# Patient Record
Sex: Female | Born: 1955
Health system: Southern US, Community
[De-identification: ages and names within clinical notes are randomized; demographics above are authoritative.]

## PROBLEM LIST (undated history)

## (undated) DIAGNOSIS — E785 Hyperlipidemia, unspecified: Secondary | ICD-10-CM

## (undated) DIAGNOSIS — G4730 Sleep apnea, unspecified: Secondary | ICD-10-CM

## (undated) DIAGNOSIS — M8580 Other specified disorders of bone density and structure, unspecified site: Secondary | ICD-10-CM

## (undated) HISTORY — PX: SHOULDER SURGERY: SHX246

## (undated) HISTORY — PX: FRACTURE SURGERY: SHX138

## (undated) HISTORY — DX: Hyperlipidemia, unspecified: E78.5

## (undated) HISTORY — DX: Other specified disorders of bone density and structure, unspecified site: M85.80

## (undated) HISTORY — DX: Sleep apnea, unspecified: G47.30

---

## 2004-02-17 ENCOUNTER — Encounter: Payer: Self-pay | Admitting: Family Medicine

## 2004-06-07 ENCOUNTER — Ambulatory Visit: Payer: Self-pay | Admitting: Obstetrics and Gynecology

## 2005-07-20 ENCOUNTER — Encounter: Payer: Self-pay | Admitting: Family Medicine

## 2005-09-21 ENCOUNTER — Ambulatory Visit: Payer: Self-pay | Admitting: Obstetrics and Gynecology

## 2005-09-27 ENCOUNTER — Ambulatory Visit: Payer: Self-pay | Admitting: Obstetrics and Gynecology

## 2006-02-13 ENCOUNTER — Ambulatory Visit: Payer: Self-pay | Admitting: Family Medicine

## 2006-04-30 ENCOUNTER — Ambulatory Visit: Payer: Self-pay | Admitting: Obstetrics and Gynecology

## 2006-05-28 ENCOUNTER — Ambulatory Visit: Payer: Self-pay | Admitting: Family Medicine

## 2007-10-29 ENCOUNTER — Ambulatory Visit: Payer: Self-pay | Admitting: Obstetrics and Gynecology

## 2007-10-29 ENCOUNTER — Ambulatory Visit: Payer: Self-pay | Admitting: Family Medicine

## 2007-12-16 ENCOUNTER — Encounter: Payer: Self-pay | Admitting: Family Medicine

## 2007-12-16 ENCOUNTER — Ambulatory Visit: Payer: Self-pay | Admitting: Gastroenterology

## 2008-05-20 ENCOUNTER — Ambulatory Visit: Payer: Self-pay | Admitting: Family Medicine

## 2008-05-20 DIAGNOSIS — M899 Disorder of bone, unspecified: Secondary | ICD-10-CM | POA: Insufficient documentation

## 2008-05-20 DIAGNOSIS — M810 Age-related osteoporosis without current pathological fracture: Secondary | ICD-10-CM

## 2008-05-20 DIAGNOSIS — F329 Major depressive disorder, single episode, unspecified: Secondary | ICD-10-CM | POA: Insufficient documentation

## 2008-07-07 ENCOUNTER — Ambulatory Visit: Payer: Self-pay | Admitting: Family Medicine

## 2008-12-23 ENCOUNTER — Encounter: Payer: Self-pay | Admitting: Family Medicine

## 2008-12-23 ENCOUNTER — Other Ambulatory Visit: Admission: RE | Admit: 2008-12-23 | Discharge: 2008-12-23 | Payer: Self-pay | Admitting: Family Medicine

## 2008-12-23 ENCOUNTER — Ambulatory Visit: Payer: Self-pay | Admitting: Family Medicine

## 2008-12-25 LAB — CONVERTED CEMR LAB
Albumin: 3.8 g/dL (ref 3.5–5.2)
Alkaline Phosphatase: 74 units/L (ref 39–117)
Basophils Absolute: 0 10*3/uL (ref 0.0–0.1)
Basophils Relative: 0 % (ref 0.0–3.0)
Calcium: 9.4 mg/dL (ref 8.4–10.5)
Eosinophils Relative: 1 % (ref 0.0–5.0)
GFR calc non Af Amer: 92.51 mL/min (ref >60)
Glucose, Bld: 75 mg/dL (ref 70–99)
HCT: 39.1 % (ref 36.0–46.0)
HDL: 70.4 mg/dL (ref >39.00)
Hemoglobin: 13.2 g/dL (ref 12.0–15.0)
Lymphs Abs: 1.8 10*3/uL (ref 0.7–4.0)
Monocytes Relative: 9.7 % (ref 3.0–12.0)
Neutro Abs: 1.6 10*3/uL (ref 1.4–7.7)
RDW: 15.3 % — ABNORMAL HIGH (ref 11.5–14.6)
Sodium: 144 meq/L (ref 135–145)
Total CHOL/HDL Ratio: 3
Triglycerides: 60 mg/dL (ref 0.0–149.0)

## 2008-12-29 ENCOUNTER — Encounter (INDEPENDENT_AMBULATORY_CARE_PROVIDER_SITE_OTHER): Payer: Self-pay | Admitting: *Deleted

## 2008-12-29 ENCOUNTER — Ambulatory Visit: Payer: Self-pay | Admitting: Family Medicine

## 2008-12-29 ENCOUNTER — Encounter: Payer: Self-pay | Admitting: Family Medicine

## 2009-01-05 ENCOUNTER — Encounter: Payer: Self-pay | Admitting: Family Medicine

## 2009-01-05 ENCOUNTER — Ambulatory Visit: Payer: Self-pay | Admitting: Family Medicine

## 2009-01-14 ENCOUNTER — Encounter (INDEPENDENT_AMBULATORY_CARE_PROVIDER_SITE_OTHER): Payer: Self-pay | Admitting: *Deleted

## 2009-03-03 ENCOUNTER — Ambulatory Visit: Payer: Self-pay | Admitting: Family Medicine

## 2009-03-03 DIAGNOSIS — E7800 Pure hypercholesterolemia, unspecified: Secondary | ICD-10-CM | POA: Insufficient documentation

## 2009-03-04 ENCOUNTER — Ambulatory Visit: Payer: Self-pay | Admitting: Family Medicine

## 2009-03-08 LAB — CONVERTED CEMR LAB
ALT: 24 units/L (ref 0–35)
AST: 22 units/L (ref 0–37)
Basophils Absolute: 0 10*3/uL (ref 0.0–0.1)
Basophils Relative: 0.5 % (ref 0.0–3.0)
Cholesterol: 207 mg/dL — ABNORMAL HIGH (ref 0–200)
Direct LDL: 125.9 mg/dL
Eosinophils Absolute: 0 10*3/uL (ref 0.0–0.7)
Eosinophils Relative: 0.8 % (ref 0.0–5.0)
HCT: 37.4 % (ref 36.0–46.0)
HDL: 72.3 mg/dL (ref >39.00)
Hemoglobin: 12.8 g/dL (ref 12.0–15.0)
Lymphocytes Relative: 37 % (ref 12.0–46.0)
Lymphs Abs: 1.7 10*3/uL (ref 0.7–4.0)
MCHC: 34.2 g/dL (ref 30.0–36.0)
MCV: 95 fL (ref 78.0–100.0)
Monocytes Absolute: 0.4 10*3/uL (ref 0.1–1.0)
Monocytes Relative: 9.5 % (ref 3.0–12.0)
Neutro Abs: 2.5 10*3/uL (ref 1.4–7.7)
Neutrophils Relative %: 52.2 % (ref 43.0–77.0)
Platelets: 191 10*3/uL (ref 150.0–400.0)
RBC: 3.94 M/uL (ref 3.87–5.11)
RDW: 13.1 % (ref 11.5–14.6)
Total CHOL/HDL Ratio: 3
Triglycerides: 77 mg/dL (ref 0.0–149.0)
VLDL: 15.4 mg/dL (ref 0.0–40.0)
Vit D, 25-Hydroxy: 38 ng/mL (ref 30–89)
WBC: 4.6 10*3/uL (ref 4.5–10.5)

## 2009-12-30 ENCOUNTER — Ambulatory Visit: Payer: Self-pay | Admitting: Family Medicine

## 2009-12-30 DIAGNOSIS — E669 Obesity, unspecified: Secondary | ICD-10-CM | POA: Insufficient documentation

## 2009-12-30 DIAGNOSIS — E663 Overweight: Secondary | ICD-10-CM | POA: Insufficient documentation

## 2009-12-30 DIAGNOSIS — E6609 Other obesity due to excess calories: Secondary | ICD-10-CM | POA: Insufficient documentation

## 2010-01-03 ENCOUNTER — Telehealth: Payer: Self-pay | Admitting: Family Medicine

## 2010-01-13 ENCOUNTER — Ambulatory Visit: Payer: Self-pay | Admitting: Pulmonary Disease

## 2010-01-20 ENCOUNTER — Encounter: Payer: Self-pay | Admitting: Pulmonary Disease

## 2010-01-21 ENCOUNTER — Encounter: Payer: Self-pay | Admitting: Pulmonary Disease

## 2010-01-21 ENCOUNTER — Ambulatory Visit (HOSPITAL_BASED_OUTPATIENT_CLINIC_OR_DEPARTMENT_OTHER): Admission: RE | Admit: 2010-01-21 | Discharge: 2010-01-21 | Payer: Self-pay | Admitting: Pulmonary Disease

## 2010-01-24 ENCOUNTER — Telehealth: Payer: Self-pay | Admitting: Pulmonary Disease

## 2010-02-02 ENCOUNTER — Telehealth (INDEPENDENT_AMBULATORY_CARE_PROVIDER_SITE_OTHER): Payer: Self-pay | Admitting: *Deleted

## 2010-02-04 ENCOUNTER — Ambulatory Visit: Payer: Self-pay | Admitting: Family Medicine

## 2010-02-04 ENCOUNTER — Ambulatory Visit: Payer: Self-pay | Admitting: Pulmonary Disease

## 2010-02-04 ENCOUNTER — Encounter: Payer: Self-pay | Admitting: Family Medicine

## 2010-02-04 ENCOUNTER — Telehealth (INDEPENDENT_AMBULATORY_CARE_PROVIDER_SITE_OTHER): Payer: Self-pay | Admitting: *Deleted

## 2010-02-07 ENCOUNTER — Encounter: Payer: Self-pay | Admitting: Family Medicine

## 2010-02-16 ENCOUNTER — Ambulatory Visit: Payer: Self-pay | Admitting: Pulmonary Disease

## 2010-02-16 DIAGNOSIS — G4733 Obstructive sleep apnea (adult) (pediatric): Secondary | ICD-10-CM | POA: Insufficient documentation

## 2010-03-08 ENCOUNTER — Telehealth: Payer: Self-pay | Admitting: Family Medicine

## 2010-03-08 ENCOUNTER — Ambulatory Visit: Payer: Self-pay | Admitting: Family Medicine

## 2010-03-08 ENCOUNTER — Telehealth (INDEPENDENT_AMBULATORY_CARE_PROVIDER_SITE_OTHER): Payer: Self-pay | Admitting: *Deleted

## 2010-03-08 LAB — CONVERTED CEMR LAB
ALT: 36 units/L — ABNORMAL HIGH (ref 0–35)
AST: 25 units/L (ref 0–37)
Albumin: 3.7 g/dL (ref 3.5–5.2)
BUN: 22 mg/dL (ref 6–23)
Chloride: 106 meq/L (ref 96–112)
Direct LDL: 153.9 mg/dL
Eosinophils Relative: 1.6 % (ref 0.0–5.0)
Glucose, Bld: 81 mg/dL (ref 70–99)
HCT: 38.6 % (ref 36.0–46.0)
Hemoglobin: 13.5 g/dL (ref 12.0–15.0)
Lymphs Abs: 1.7 10*3/uL (ref 0.7–4.0)
MCV: 91.6 fL (ref 78.0–100.0)
Monocytes Absolute: 0.4 10*3/uL (ref 0.1–1.0)
Monocytes Relative: 9.3 % (ref 3.0–12.0)
Neutro Abs: 1.9 10*3/uL (ref 1.4–7.7)
Platelets: 216 10*3/uL (ref 150.0–400.0)
Potassium: 4.5 meq/L (ref 3.5–5.1)
Sodium: 141 meq/L (ref 135–145)
TSH: 2.4 microintl units/mL (ref 0.35–5.50)
Total Bilirubin: 0.7 mg/dL (ref 0.3–1.2)
Total Protein: 6.3 g/dL (ref 6.0–8.3)
VLDL: 7.6 mg/dL (ref 0.0–40.0)
WBC: 4.1 10*3/uL — ABNORMAL LOW (ref 4.5–10.5)

## 2010-03-10 ENCOUNTER — Ambulatory Visit: Payer: Self-pay | Admitting: Family Medicine

## 2010-04-27 ENCOUNTER — Telehealth: Payer: Self-pay | Admitting: Family Medicine

## 2010-05-25 ENCOUNTER — Telehealth: Payer: Self-pay | Admitting: Family Medicine

## 2010-05-31 ENCOUNTER — Ambulatory Visit
Admission: RE | Admit: 2010-05-31 | Discharge: 2010-05-31 | Payer: Self-pay | Source: Home / Self Care | Attending: Family Medicine | Admitting: Family Medicine

## 2010-06-02 ENCOUNTER — Ambulatory Visit
Admission: RE | Admit: 2010-06-02 | Discharge: 2010-06-02 | Payer: Self-pay | Source: Home / Self Care | Attending: Family Medicine | Admitting: Family Medicine

## 2010-06-05 LAB — CONVERTED CEMR LAB
AST: 20 units/L (ref 0–37)
Cholesterol: 215 mg/dL (ref 0–200)
HDL: 77.9 mg/dL (ref >39.0)
Triglycerides: 64 mg/dL (ref 0–149)

## 2010-06-07 NOTE — Miscellaneous (Signed)
Summary: Orders Update  Clinical Lists Changes  Orders: Added new Referral order of Sleep Disorder Referral (Sleep Disorder) - Signed 

## 2010-06-07 NOTE — Assessment & Plan Note (Signed)
Summary: PAP SMEAR AND CPX/CLE   Vital Signs:  Patient profile:   55 year old female Height:      64.5 inches Weight:      231.75 pounds BMI:     39.31 Temp:     97.9 degrees F oral Pulse rate:   80 / minute Pulse rhythm:   regular BP sitting:   122 / 82  (left arm) Cuff size:   large  Vitals Entered By: Lewanda Rife LPN (March 10, 2010 10:52 AM) CC: CPX LMP 2006   History of Present Illness: here for wellness exam and to rev chronic health problems  is feeling good   nothing new   wt is down 1 lb with bmi of 39  122/82-- good bp  nl pap 8/10 all paps nl since leep yearly in 1997  no symptoms or problems or new partners  no periods   mam 9/11 nl  self exam -- no lumps or changes   colonosc nl in 09- due 10 y  dexa 8/10 osteopenia nl vit D at 60 is exercising and taking calcium   she did go off lexapro and doing ok  is more emotional    lipids are up with trig 38 and HDL 93 and LDL 153 (up from the 120s) starting 10 days ago -- started low carb high protien  more beef and cheese   has started exercising -- spinning classes and power toning class  was exercising 4-5 days per week since june  went off weight watchers in summer --  has succeeded in the past    Allergies (verified): No Known Drug Allergies  Past History:  Past Medical History: Last updated: 12/23/2008 cervical deg disc dz osteopenia depression obesity PMDD vaginal stricture hyperlipidemia  GI-- Dr Marva Panda neurosx -- [facility]  Past Surgical History: Last updated: 12/23/2008 colonosc 9/09- diverticulosis -- re check 10 y MRI (years ago) - cervical disk herniation with bone spurs  (1981) Cryotherapy for dysplasia   (1610) CS hysteroscopy/ d/c- leiomyoma- proliferative endometrium (1997) LEEP  (2005) fx in hand (2005) dexa  Family History: Last updated: 12/23/2008 father- leukemia mother non- hodgekins lymphoma MGM bone ca   Social History: Last updated:  01/13/2010 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 States she was a "social smoker."  Risk Factors: Smoking Status: quit (01/13/2010)  Review of Systems General:  Denies fatigue, loss of appetite, and malaise. Eyes:  Denies blurring and eye irritation. CV:  Denies chest pain or discomfort, lightheadness, palpitations, and shortness of breath with exertion. Resp:  Denies cough, shortness of breath, and wheezing. GI:  Denies abdominal pain, change in bowel habits, indigestion, and nausea. GU:  Denies abnormal vaginal bleeding, discharge, dysuria, and urinary frequency. MS:  Denies joint pain, joint redness, joint swelling, muscle aches, and cramps. Derm:  Denies itching, lesion(s), poor wound healing, and rash. Neuro:  Denies headaches, numbness, and tingling. Psych:  mood is ok . Endo:  Denies excessive thirst and excessive urination. Heme:  Denies abnormal bruising and bleeding.  Physical Exam  General:  overweight but generally well appearing  Head:  normocephalic, atraumatic, and no abnormalities observed.   Eyes:  vision grossly intact, pupils equal, pupils round, and pupils reactive to light.  no conjunctival pallor, injection or icterus  Ears:  R ear normal and L ear normal.   Nose:  no nasal discharge.   Mouth:  pharynx pink and moist.   Neck:  supple with full rom  and no masses or thyromegally, no JVD or carotid bruit  Chest Wall:  No deformities, masses, or tenderness noted. Breasts:  No mass, nodules, thickening, tenderness, bulging, retraction, inflamation, nipple discharge or skin changes noted.   Lungs:  Normal respiratory effort, chest expands symmetrically. Lungs are clear to auscultation, no crackles or wheezes. Heart:  Normal rate and regular rhythm. S1 and S2 normal without gallop, murmur, click, rub or other extra sounds. Abdomen:  Bowel sounds positive,abdomen soft and non-tender without  masses, organomegaly or hernias noted. Msk:  No deformity or scoliosis noted of thoracic or lumbar spine.   Pulses:  R and L carotid,radial,femoral,dorsalis pedis and posterior tibial pulses are full and equal bilaterally Extremities:  No clubbing, cyanosis, edema, or deformity noted with normal full range of motion of all joints.   Neurologic:  sensation intact to light touch, gait normal, and DTRs symmetrical and normal.   Skin:  Intact without suspicious lesions or rashes Cervical Nodes:  No lymphadenopathy noted Axillary Nodes:  No palpable lymphadenopathy Inguinal Nodes:  No significant adenopathy Psych:  normal affect, talkative and pleasant    Impression & Recommendations:  Problem # 1:  HEALTH MAINTENANCE EXAM (ICD-V70.0) Assessment Comment Only reviewed health habits including diet, exercise and skin cancer prevention reviewed health maintenance list and family history disc plan for wt loss rev labs in detail  Problem # 2:  OSTEOPENIA (ICD-733.90) Assessment: Unchanged dexa due next fall good vit D level rev supplements and exercise  Her updated medication list for this problem includes:    Vitamin D3 2000 Unit Tabs (Cholecalciferol) ..... One tab once daily    Calcium 600 600 Mg Tabs (Calcium carbonate) ..... One tab once daily    Calcium Carbonate-vitamin D 600-400 Mg-unit Tabs (Calcium carbonate-vitamin d) .Marland Kitchen... Take 1 tablet by mouth once a day  Problem # 3:  PURE HYPERCHOLESTEROLEMIA (ICD-272.0) Assessment: Deteriorated this is worse on high protien/ low carb diet too many sat fats adv to try fruit/ veg and low fat  will re check in 3 mo rev low sat fat diet in detail  Complete Medication List: 1)  One-a-day Womens Formula Tabs (Multiple vitamins-calcium) .... Take 1 tablet by mouth once a day 2)  Vitamin D3 2000 Unit Tabs (Cholecalciferol) .... One tab once daily 3)  Vitamin B Complex-c Caps (B complex-c) .... Take 1 capsule by mouth once a day 4)  Calcium  600 600 Mg Tabs (Calcium carbonate) .... One tab once daily 5)  Chromium Picolinate 800 Mcg Tabs (Chromium picolinate) .... One tab once daily 6)  Calcium Carbonate-vitamin D 600-400 Mg-unit Tabs (Calcium carbonate-vitamin d) .... Take 1 tablet by mouth once a day 7)  Fish Oil 1000 Mg Caps (Omega-3 fatty acids) .... Take 1 capsule by mouth once a day  Patient Instructions: 1)  stop chromium  2)  get back to weight watchers 3)  you can raise your HDL (good cholesterol) by increasing exercise and eating omega 3 fatty acid supplement like fish oil or flax seed oil over the counter 4)  you can lower LDL (bad cholesterol) by limiting saturated fats in diet like red meat, fried foods, egg yolks, fatty breakfast meats, high fat dairy products and shellfish 5)  schedule fasting lab in 3 months lipid/ast/alt 272 to see if chol goes down  6)  keep up the great exercise    Orders Added: 1)  Est. Patient 40-64 years [40347]    Current Allergies (reviewed today): No known allergies

## 2010-06-07 NOTE — Progress Notes (Signed)
Summary: speak to Emma Knight  Phone Note Call from Patient Call back at [facility] Phone [phone]   Caller: Patient Call For: Emma Knight Summary of Call: pt requests to speak ro Emma Knight re: ins.  Initial call taken by: Tivis Ringer, CNA,  January 24, 2010 9:18 AM  Follow-up for Phone Call        [facility] for pt to return my call. Emma Knight  January 24, 2010 10:28 AM Returned pt's call and she recvd. denial letter for the apnea link and was concerned that [employer] would not pay for her formal sleep study in the lab of which she had Friday 01/21/10. Pt read her denial to me and it was the same denial that we had recvd. for the apnea link Home Study which was denied. Pt thought that [employer] was not going to cover her formal study. Advised pt that [employer] denied apnea link but would cover her formal study (NPSG). Pt understood after speaking with [place], that this was just in reference to the apnea link. Alfonso Ramus  January 24, 2010 12:13 PM

## 2010-06-07 NOTE — Letter (Signed)
Summary: Results Follow up Letter  [facility] at [facility]  [address] [place], [place] [address]   Phone: [phone]  Fax: [phone]    02/07/2010 MRN: [id number]    ELLIEANA DOLECKI [address] [place], [place]  [address]    Dear Ms. KEEVEN,  The following are the results of your recent test(s):  Test         Result    Pap Smear:        Normal _____  Not Normal _____ Comments: ______________________________________________________ Cholesterol: LDL(Bad cholesterol):         Your goal is less than:         HDL (Good cholesterol):       Your goal is more than: Comments:  ______________________________________________________ Mammogram:        Normal __X___  Not Normal _____ Comments:Repeat in one year.   ___________________________________________________________________ Hemoccult:        Normal _____  Not normal _______ Comments:    _____________________________________________________________________ Other Tests:    We routinely do not discuss normal results over the telephone.  If you desire a copy of the results, or you have any questions about this information we can discuss them at your next office visit.   Sincerely,    Idamae Schuller Tower,MD  MT/ri

## 2010-06-07 NOTE — Assessment & Plan Note (Signed)
Summary: consult for possible osa   Visit Type:  Initial Consult Copy to:  Colon Flattery tower MD Primary Denea Cheaney/Referring Weldon Nouri:  Judith Part MD  CC:  Sleep Consult. Pt states she has been told she has on and off snoring through the night and Pt states she was told she quit breathing at times when she slept.  History of Present Illness: the pt is a 55y/o female who I have been asked to see for possible osa.  She has been noted to have loud snoring by various roommates on trips, and most recently was noted to have an abnormal breathing pattern during sleep by a friend who is a Engineer, civil (consulting).  She denies any choking arousals.  She typically goes to bed btw 10-11pm, and arises at 5-8am to start her day.  She usually feels rested.  She describes some daytime sleepiness during the day when doing quiet things, but does not feel it affects her alertness, concentration, or QOL.  She has no sleepiness in the evening while watching tv, and no issues with driving.  Her epworth score today is only 4.  She has been trying to exercise more, and her weight is down 25 pounds over the past year or so.    Preventive Screening-Counseling & Management  Alcohol-Tobacco     Smoking Status: quit  Problems Prior to Update: 1)  Obesity  (ICD-278.00) 2)  Snoring  (ICD-786.09) 3)  Mass, Superficial  (ICD-782.2) 4)  Lymphangitis  (ICD-457.2) 5)  Leukocytopenia Unspecified  (ICD-288.50) 6)  Pure Hypercholesterolemia  (ICD-272.0) 7)  Other Screening Mammogram  (ICD-V76.12) 8)  Routine Gynecological Examination  (ICD-V72.31) 9)  Health Maintenance Exam  (ICD-V70.0) 10)  Knee Pain, Right  (ICD-719.46) 11)  Elevated Blood Pressure Without Diagnosis of Hypertension  (ICD-796.2) 12)  Osteopenia  (ICD-733.90) 13)  Depression  (ICD-311) 14)  Neck Pain  (ICD-723.1) 15)  Back Pain, Lumbar  (ICD-724.2)  Current Medications (verified): 1)  One-A-Day Womens Formula  Tabs (Multiple Vitamins-Calcium) .... Take 1 Tablet By  Mouth Once A Day 2)  Vitamin D3 2000 Unit Tabs (Cholecalciferol) .... One Tab Once Daily 3)  Lexapro 20 Mg Tabs (Escitalopram Oxalate) .... 1/2 Tablet By Mouth Once A Day. 4)  Vitamin B Complex-C   Caps (B Complex-C) .... Take 1 Capsule By Mouth Once A Day 5)  Calcium 600 600 Mg Tabs (Calcium Carbonate) .... One Tab Once Daily 6)  Chromium Picolinate 800 Mcg Tabs (Chromium Picolinate) .... One Tab Once Daily  Allergies (verified): No Known Drug Allergies  Past History:  Past Medical History: Reviewed history from 12/23/2008 and no changes required. cervical deg disc dz osteopenia depression obesity PMDD vaginal stricture hyperlipidemia  GI-- Dr Marva Panda neurosx -- [facility]  Past Surgical History: Reviewed history from 12/23/2008 and no changes required. colonosc 9/09- diverticulosis -- re check 10 y MRI (years ago) - cervical disk herniation with bone spurs  (1981) Cryotherapy for dysplasia   (1610) CS hysteroscopy/ d/c- leiomyoma- proliferative endometrium (1997) LEEP  (2005) fx in hand (2005) dexa  Family History: Reviewed history from 12/23/2008 and no changes required. father- leukemia mother non- hodgekins lymphoma MGM bone ca   Social History: Reviewed history from 05/20/2008 and no changes required. 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 States she was a "social smoker." Smoking Status:  quit  Review of Systems       The patient complains of tooth/dental problems.  The patient denies shortness of breath at rest, productive cough,  non-productive cough, coughing up blood, chest pain, irregular heartbeats, acid heartburn, indigestion, loss of appetite, weight change, abdominal pain, difficulty swallowing, sore throat, headaches, nasal congestion/difficulty breathing through nose, sneezing, itching, ear ache, anxiety, depression, hand/feet swelling, joint stiffness or pain, rash, change in  color of mucus, and fever.    Vital Signs:  Patient profile:   55 year old female Height:      65 inches Weight:      229.25 pounds O2 Sat:      96 % on Room air Temp:     98.9 degrees F oral Pulse rate:   66 / minute BP sitting:   130 / 78  (right arm) Cuff size:   large  Vitals Entered By: Carver Fila (January 13, 2010 2:27 PM)  O2 Flow:  Room air CC: Sleep Consult. Pt states she has been told she has on and off snoring through the night, Pt states she was told she quit breathing at times when she slept Comments meds and allergies updated Phone number updated Carver Fila  January 13, 2010 2:30 PM    Physical Exam  General:  obese female in nad Eyes:  PERRLA and EOMI.   Nose:  narrowed, but patent Mouth:  moderate elongation of soft palate and uvula Neck:  no jvd, tmg, LN Lungs:  clear to auscultation Heart:  rrr, no mrg Abdomen:  soft and nontender, bs+ Extremities:  no definite edema, no cyanosis pulses intact distally Neurologic:  alert and oriented, moves all 4.   Impression & Recommendations:  Problem # 1:  SNORING (ICD-786.09) the pt has loud snoring and witnessed apneic episodes, but does not have issues with nonrestorative sleep or significant daytime sleepiness.  She is certainly at risk with her obesity and abnormal upper airway anatomy for sleep apnea though.  At this point, my overall suspicion for clinically significant sleep disordered breathing is low, but I do think she needs to be screened in some fashion.  I would like to set her up for a home sleep study, and will arrange f/u once the results are available.  I have also encouraged her to work aggressively on weight loss.  Medications Added to Medication List This Visit: 1)  Vitamin D3 2000 Unit Tabs (Cholecalciferol) .... One tab once daily 2)  Calcium 600 600 Mg Tabs (Calcium carbonate) .... One tab once daily 3)  Chromium Picolinate 800 Mcg Tabs (Chromium picolinate) .... One tab once  daily  Other Orders: Consultation Level IV ([address]) Misc. Referral (Misc. Ref)  Patient Instructions: 1)  will do home sleep study to evaluate you for sleep apnea 2)  continue to work on weight loss as you are doing. 3)  will call you when I have the results of your sleep study.   Immunization History:  Influenza Immunization History:    Influenza:  historical (01/25/2009)

## 2010-06-07 NOTE — Progress Notes (Signed)
Summary: sleep results - will call when results are available  Phone Note Call from Patient Call back at Home Phone [phone]   Caller: Patient Call For: clance Reason for Call: Lab or Test Results Summary of Call: Wants sleep results. Initial call taken by: Darletta Moll,  February 02, 2010 4:27 PM  Follow-up for Phone Call        per EMR, sleep study scheduled for 9.16.11 - was it done on that date?  usually takes at least 2 weeks; we will call when the results are available.  ATC pt, just kep ringing; no option to LM. Boone Master CNA/MA  February 02, 2010 4:30 PM   pt called back.  states the sleep study was done on 9.16.11; will be 2 weeks this friday.  informed her that it takes at least 2 weeks for the results, then time for [facility] to interpret and we will call when the results are back.  pt okay with this and verbalized her understanding.  verified home number. Follow-up by: Boone Master CNA/MA,  February 02, 2010 4:43 PM

## 2010-06-07 NOTE — Progress Notes (Signed)
Summary: wants CA125  Phone Note Call from Patient Call back at Home Phone [phone] Call back at [phone]   Caller: Patient Call For: Judith Part MD Summary of Call: Pt said she had lab work this morning and would like to have a CA 125 test added.  She has a family hx of cancer, but not ovarian. Initial call taken by: Lowella Petties CMA, AAMA,  March 08, 2010 9:22 AM  Follow-up for Phone Call        I do not use ca 125 as a screening test- is only diagnostic  too many false positives to screen with it   Follow-up by: Judith Part MD,  March 08, 2010 11:27 AM  Additional Follow-up for Phone Call Additional follow up Details #1::        Patient Advised.  Additional Follow-up by: Delilah Shan CMA Duncan Dull),  March 08, 2010 2:39 PM

## 2010-06-07 NOTE — Assessment & Plan Note (Signed)
Summary: rov to discuss sleep study results/ms   Visit Type:  Follow-up Copy to:  Colon Flattery tower MD Primary Provider/Referring Provider:  Colon Flattery Tower MD  CC:  pt here to discuss sleep study results. pt states she is not having any problems.  History of Present Illness: The pt comes in today for f/u of her recent sleep study.  She was found to have mild osa with AHI of 10/hr and desat transiently to 83%.  She also had small to moderate numbers of leg jerks with little sleep disruption, but denies any symptoms c/w RLS.  I have reviewed her study in detail with her, and answered all of her questions.  Current Medications (verified): 1)  One-A-Day Womens Formula  Tabs (Multiple Vitamins-Calcium) .... Take 1 Tablet By Mouth Once A Day 2)  Vitamin D3 2000 Unit Tabs (Cholecalciferol) .... One Tab Once Daily 3)  Vitamin B Complex-C   Caps (B Complex-C) .... Take 1 Capsule By Mouth Once A Day 4)  Calcium 600 600 Mg Tabs (Calcium Carbonate) .... One Tab Once Daily 5)  Chromium Picolinate 800 Mcg Tabs (Chromium Picolinate) .... One Tab Once Daily  Allergies (verified): No Known Drug Allergies  Past History:  Past medical, surgical, family and social histories (including risk factors) reviewed, and no changes noted (except as noted below).  Past Medical History: Reviewed history from 12/23/2008 and no changes required. cervical deg disc dz osteopenia depression obesity PMDD vaginal stricture hyperlipidemia  GI-- Dr Marva Panda neurosx -- [facility]  Past Surgical History: Reviewed history from 12/23/2008 and no changes required. colonosc 9/09- diverticulosis -- re check 10 y MRI (years ago) - cervical disk herniation with bone spurs  (1981) Cryotherapy for dysplasia   (6045) CS hysteroscopy/ d/c- leiomyoma- proliferative endometrium (1997) LEEP  (2005) fx in hand (2005) dexa  Family History: Reviewed history from 12/23/2008 and no changes required. father- leukemia mother non-  hodgekins lymphoma MGM bone ca   Social History: Reviewed history from 01/13/2010 and no changes required. 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 States she was a "social smoker."  Review of Systems  The patient denies shortness of breath with activity, shortness of breath at rest, productive cough, non-productive cough, coughing up blood, chest pain, irregular heartbeats, acid heartburn, indigestion, loss of appetite, weight change, abdominal pain, difficulty swallowing, sore throat, tooth/dental problems, headaches, nasal congestion/difficulty breathing through nose, sneezing, itching, ear ache, anxiety, depression, hand/feet swelling, joint stiffness or pain, rash, change in color of mucus, and fever.    Vital Signs:  Patient profile:   55 year old female Height:      65 inches Weight:      232 pounds BMI:     38.75 O2 Sat:      99 % on Room air Temp:     98.5 degrees F oral Pulse rate:   73 / minute BP sitting:   122 / 80  (left arm) Cuff size:   large  Vitals Entered By: Carver Fila (February 16, 2010 12:01 PM)  O2 Flow:  Room air CC: pt here to discuss sleep study results. pt states she is not having any problems Comments meds and allergies updated Phone number updated Carver Fila  February 16, 2010 12:01 PM    Physical Exam  General:  obese female in nad Extremities:  no significant edema or cyanosis Neurologic:  alert, does not appear sleepy, moves all 4.   Impression & Recommendations:  Problem # 1:  OBSTRUCTIVE SLEEP APNEA (ICD-327.23)  the pt has mild osa by her recent sleep study, and the decision to treat this aggressively should depend on its impact to her QOL.  It really is not a significant risk to CV health at this level of severity.  I have outlined conservative treatments such as positional therapy and weight loss vs more proactive treatments such as dental appliance and cpap.  Her  decision should really be based on how much this bothers her, and what will fit well with her lifestyle.  I have given her educational material on dental appliances, and she will call me with her decision.    Other Orders: Est. Patient Level III ([address])  Patient Instructions: 1)  consider a trial of weight loss vs cpap vs dental appliance.  Let me know your decision

## 2010-06-07 NOTE — Progress Notes (Signed)
Summary: scheduled to discuss sleep study results.   Phone Note Outgoing Call   Call placed by: Carver Fila,  February 04, 2010 9:06 AM Call placed to: Patient Summary of Call: [id number] x1. Pt needs ov with kc to discuss sleep study results. Carver Fila  February 04, 2010 9:06 AM   Follow-up for Phone Call        called and spoke with pt and she is coming in on oct. 12 at 11:15 to discuss her sleep study results.  Carver Fila  February 07, 2010 9:17 AM

## 2010-06-07 NOTE — Assessment & Plan Note (Signed)
Summary: ?REFERRAL FOR SLEEP STUDY/CLE   Vital Signs:  Patient profile:   55 year old female Height:      65 inches Weight:      229.50 pounds BMI:     38.33 Temp:     98.3 degrees F oral Pulse rate:   80 / minute Pulse rhythm:   regular BP sitting:   116 / 82  (left arm) Cuff size:   large  Vitals Entered By: Lewanda Rife LPN (December 30, 2009 9:07 AM) CC: ?referral for sleep study   History of Present Illness: wt is down 20 lb- since last oct doing bootcamp for exercise -- likes it  still going to the gym doing weight watchers  now hcg supplement program- and only eats 500 calories per day   has known for a long time that she snores - thinks she has sleep apnea  went to the beach with a friend -- who noted that she had apnea/ breath holding  does wake up tired and unrested  no headaches  her dad had cpap  occ will doze during the day no sleepy driving, however   sleeps 7 hours per night   does exercise regularly    never had cva or MI   is weaning off of lexapro - and doing well with that was originally on that during a stressful divorce - and stress is much less now       Allergies (verified): No Known Drug Allergies  Past History:  Past Medical History: Last updated: 12/23/2008 cervical deg disc dz osteopenia depression obesity PMDD vaginal stricture hyperlipidemia  GI-- Dr Marva Panda neurosx -- [facility]  Past Surgical History: Last updated: 12/23/2008 colonosc 9/09- diverticulosis -- re check 10 y MRI (years ago) - cervical disk herniation with bone spurs  (1981) Cryotherapy for dysplasia   (1987) CS hysteroscopy/ d/c- leiomyoma- proliferative endometrium (1997) LEEP  (2005) fx in hand (2005) dexa  Family History: Last updated: 12/23/2008 father- leukemia mother non- hodgekins lymphoma MGM bone ca   Social History: Last updated: 05/20/2008 divorced  non smoker  Review of Systems General:  Complains of fatigue; denies fever,  loss of appetite, and malaise. Eyes:  Denies blurring and eye irritation. CV:  Denies chest pain or discomfort, lightheadness, palpitations, and shortness of breath with exertion. Resp:  Denies cough and wheezing. GI:  Denies abdominal pain, change in bowel habits, indigestion, and nausea. MS:  Denies cramps and muscle weakness. Derm:  Denies itching, lesion(s), poor wound healing, and rash. Neuro:  Denies headaches, memory loss, numbness, and tingling. Psych:  Denies anxiety and depression. Endo:  Denies excessive thirst and excessive urination. Heme:  Denies abnormal bruising and bleeding.  Physical Exam  General:  obese and well appearing Head:  normocephalic, atraumatic, and no abnormalities observed.   Eyes:  vision grossly intact, pupils equal, pupils round, and pupils reactive to light.  no conjunctival pallor, injection or icterus  Mouth:  pharynx pink and moist.   Neck:  supple with full rom and no masses or thyromegally, no JVD or carotid bruit  Chest Wall:  No deformities, masses, or tenderness noted. Lungs:  Normal respiratory effort, chest expands symmetrically. Lungs are clear to auscultation, no crackles or wheezes. Heart:  Normal rate and regular rhythm. S1 and S2 normal without gallop, murmur, click, rub or other extra sounds. Abdomen:  Bowel sounds positive,abdomen soft and non-tender without masses, organomegaly or hernias noted. Pulses:  R and L carotid,radial,femoral,dorsalis pedis and posterior tibial pulses are  full and equal bilaterally Extremities:  No clubbing, cyanosis, edema, or deformity noted with normal full range of motion of all joints.   Neurologic:  sensation intact to light touch, gait normal, and DTRs symmetrical and normal.   Skin:  Intact without suspicious lesions or rashes Cervical Nodes:  No lymphadenopathy noted Psych:  normal affect, talkative and pleasant    Impression & Recommendations:  Problem # 1:  SNORING (ICD-786.09) Assessment  New  with hx of witnessed apnea and also day time fatigue  ref to sleep clinic for eval for likely sleep apnea disc how much better she could feel better with treatment   Orders: Sleep Disorder Referral (Sleep Disorder)  Problem # 2:  OBESITY (ICD-278.00) Assessment: Improved I warned pt that I do not think current diet plan is healthy or safe I feel she should return to wt watchers and exercise  Problem # 3:  DEPRESSION (ICD-311) Assessment: Improved much improved now without the situational stress -- is working on weaning off of lexapro  disc way to gradually get off of it - and update if problems The following medications were removed from the medication list:    Celexa 20 Mg Tabs (Citalopram hydrobromide) .Marland Kitchen... 1 1/2 by mouth once daily Her updated medication list for this problem includes:    Lexapro 20 Mg Tabs (Escitalopram oxalate) .Marland Kitchen... 1/2 tablet by mouth once a day.  Complete Medication List: 1)  One-a-day Womens Formula Tabs (Multiple vitamins-calcium) .... Take 1 tablet by mouth once a day 2)  Vitamin D 1000 Unit Tabs (Cholecalciferol) .... Two tabs once daily 3)  Lexapro 20 Mg Tabs (Escitalopram oxalate) .... 1/2 tablet by mouth once a day. 4)  Vitamin B Complex-c Caps (B complex-c) .... Take 1 capsule by mouth once a day  Patient Instructions: 1)  we will do sleep referral at check out  2)  I do not feel that the diet/ med that you are on is a safe option for weight loss  3)  I feel more comfortable with weight watcher   Current Allergies (reviewed today): No known allergies

## 2010-06-07 NOTE — Progress Notes (Signed)
Summary: needs order for mammogram  Phone Note Call from Patient Call back at Home Phone [phone]   Caller: Patient Call For: Judith Part MD Complaint: Urinary/GYN Problems Summary of Call: Pt needs order for  mammogram, she goes to [place].  She prefers morning appt. Initial call taken by: Lowella Petties CMA,  January 03, 2010 2:10 PM  Follow-up for Phone Call        will do order for [facility] Follow-up by: Judith Part MD,  January 03, 2010 3:28 PM  Additional Follow-up for Phone Call Additional follow up Details #1::        MMG at [facility] on 02/04/2010 at 8:30. Additional Follow-up by: Carlton Adam,  January 06, 2010 8:32 AM

## 2010-06-07 NOTE — Progress Notes (Signed)
----   Converted from flag ---- ---- 03/07/2010 8:27 PM, Colon Flattery Tower MD wrote: please check wellness, lipid and vit D v70.0 and 733.0 and 272- thanks   ---- 03/07/2010 3:14 PM, Mills Koller wrote: This patient is scheduled for CPX with you, I need lab orders with dx, please. Thanks, Terri ------------------------------

## 2010-06-09 NOTE — Assessment & Plan Note (Signed)
Summary: Read PPD and pick up paperwork//kad   Nurse Visit   Allergies: No Known Drug Allergies  PPD Results    Date of reading: 06/02/2010    Results: < 5mm    Interpretation: negative

## 2010-06-09 NOTE — Assessment & Plan Note (Signed)
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 Hearing and vision performed today as well as PPD to be read 06/02/10. She will pickup paperwork at that time as well.   Vision Screening:Left eye with correction: 20 / 20 Right eye with correction: 20 / 20 Both eyes with correction: 20 / 20  Color vision testing: normal      Vision Entered By: Selena Batten Dance CMA (AAMA) (May 31, 2010 11:25 AM)  Hearing Screen 25db HL: Left  500 hz: 25db 1000 hz: 25db 2000 hz: 25db 4000 hz: 25db Right  500 hz: 25db 1000 hz: 25db 2000 hz: 25db 4000 hz: 25db    Allergies: No Known Drug Allergies  Immunizations Administered:  PPD Skin Test:    Vaccine Type: PPD    Site: left forearm    Mfr: Sanofi Pasteur    Dose: 0.1 ml    Route: ID    Given by: Selena Batten Dance CMA (AAMA)    Exp. Date: 01/07/2012    Lot #: [id number]  Orders Added: 1)  TB Skin Test [86580] 2)  Admin 1st Vaccine [90471] 3)  Audiometry [92552] 4)  Vision Screening [phone]

## 2010-06-09 NOTE — Progress Notes (Signed)
Summary: appetite suppressant  Phone Note Call from Patient Call back at Home Phone [phone]   Caller: Patient Call For: Judith Part MD Summary of Call: Patient says that she has been trying to diet. She is asking if she  there is anything she can take suppress her appetite. She says that her friend has been taking phentermine and is it really helping. She also wanted to let you know that she started taking the Lexapro again. She sarted taking this about 2 weeks ago.  Initial call taken by: Melody Comas,  April 27, 2010 10:44 AM  Follow-up for Phone Call        I put lexapro back on med list  let her know I do not px appetite suppressants due to risks and side eff profile they work only short term  I still stand by good exercise 5 d per week and weight watchers or calorie counting Follow-up by: Judith Part MD,  April 27, 2010 1:12 PM  Additional Follow-up for Phone Call Additional follow up Details #1::        Patient notified as instructed by telephone. Lewanda Rife LPN  April 27, 2010 1:41 PM     New/Updated Medications: LEXAPRO 20 MG TABS (ESCITALOPRAM OXALATE) 1/2 by mouth once daily

## 2010-06-09 NOTE — Progress Notes (Signed)
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  Does she need an office visit for this?  I advised her that she will need a PPD.  She said she is up to date on td. Initial call taken by: Lowella Petties CMA, AAMA,  May 25, 2010 10:46 AM  Follow-up for Phone Call        I don't think she will need a visit since she had PE in nov  (barring any suprises on the form ...) please drop off form when she comes in for PPD and I will fill it out) Follow-up by: Judith Part MD,  May 25, 2010 11:24 AM  Additional Follow-up for Phone Call Additional follow up Details #1::        Spoke with patient and she will need a vison and hearing screen as well as a PPD. Nurse visit scheduled. She will drop off paperwork at that time and pick it up when PPD is read.  Additional Follow-up by: Janee Morn CMA (AAMA),  May 25, 2010 2:00 PM

## 2010-06-14 ENCOUNTER — Other Ambulatory Visit (INDEPENDENT_AMBULATORY_CARE_PROVIDER_SITE_OTHER): Payer: BC Managed Care – PPO

## 2010-06-14 ENCOUNTER — Other Ambulatory Visit: Payer: Self-pay | Admitting: Family Medicine

## 2010-06-14 ENCOUNTER — Encounter (INDEPENDENT_AMBULATORY_CARE_PROVIDER_SITE_OTHER): Payer: Self-pay | Admitting: *Deleted

## 2010-06-14 DIAGNOSIS — E785 Hyperlipidemia, unspecified: Secondary | ICD-10-CM

## 2010-06-14 DIAGNOSIS — E7800 Pure hypercholesterolemia, unspecified: Secondary | ICD-10-CM

## 2010-06-14 LAB — LDL CHOLESTEROL, DIRECT: Direct LDL: 157.7 mg/dL

## 2010-06-14 LAB — LIPID PANEL: HDL: 77.5 mg/dL (ref >39.00)

## 2010-06-14 LAB — AST: AST: 24 U/L (ref 0–37)

## 2010-06-24 ENCOUNTER — Encounter: Payer: Self-pay | Admitting: Family Medicine

## 2010-06-24 ENCOUNTER — Ambulatory Visit (INDEPENDENT_AMBULATORY_CARE_PROVIDER_SITE_OTHER): Payer: BC Managed Care – PPO | Admitting: Family Medicine

## 2010-06-24 DIAGNOSIS — E7800 Pure hypercholesterolemia, unspecified: Secondary | ICD-10-CM

## 2010-07-05 NOTE — Assessment & Plan Note (Signed)
Summary: discuss labs/ri   Vital Signs:  Patient profile:   55 year old female Height:      64.5 inches Weight:      244.75 pounds BMI:     41.51 Temp:     98.3 degrees F oral Pulse rate:   84 / minute Pulse rhythm:   regular BP sitting:   130 / 84  (left arm) Cuff size:   large  Vitals Entered By: Melody Comas (June 24, 2010 2:05 PM) CC: discuss labs    History of Present Illness: here for f/u of high cholesterol  is feeling fine    wt is up 3 lb  bp 130/84  3 mo re check of lipids not imp  trig 80 and HDL 77 and LDL 157 HDL was in 90s  (no longer doing that)   prev had done atkins got off that  did wt watchers by herself- lost 8 lb -- then gained it back plus more  tries to be attentive to what she eats   does eat too much red meat  otherwise really watches food  occ shellfish   no regular exercise -- wants to start back -- impulsive - gym    bmi 41  Allergies (verified): No Known Drug Allergies  Past History:  Past Medical History: Last updated: 12/23/2008 cervical deg disc dz osteopenia depression obesity PMDD vaginal stricture hyperlipidemia  GI-- Dr Marva Panda neurosx -- [facility]  Past Surgical History: Last updated: 12/23/2008 colonosc 9/09- diverticulosis -- re check 10 y MRI (years ago) - cervical disk herniation with bone spurs  (1981) Cryotherapy for dysplasia   (1987) CS hysteroscopy/ d/c- leiomyoma- proliferative endometrium (1997) LEEP  (2005) fx in hand (2005) dexa  Family History: Last updated: 06/24/2010 father- leukemia, ? high cholesterol  mother non- hodgekins lymphoma MGM bone ca   Social History: Last updated: 01/13/2010 divorced lives alone 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 States she was a "social smoker."  Risk Factors: Smoking Status: quit (01/13/2010)  Family History: father- leukemia, ? high cholesterol  mother non- hodgekins  lymphoma MGM bone ca   Review of Systems General:  Denies fatigue, loss of appetite, and malaise. Eyes:  Denies blurring and eye irritation. CV:  Denies chest pain or discomfort and lightheadness. Resp:  Denies cough, shortness of breath, and wheezing. Derm:  Denies lesion(s), poor wound healing, and rash. Neuro:  Denies numbness and tingling. Endo:  Denies cold intolerance, excessive thirst, excessive urination, and heat intolerance. Heme:  Denies abnormal bruising and bleeding.  Physical Exam  General:  overweight but generally well appearing  Head:  normocephalic, atraumatic, and no abnormalities observed.   Neck:  supple with full rom and no masses or thyromegally, no JVD or carotid bruit  Lungs:  Normal respiratory effort, chest expands symmetrically. Lungs are clear to auscultation, no crackles or wheezes. Heart:  Normal rate and regular rhythm. S1 and S2 normal without gallop, murmur, click, rub or other extra sounds. Msk:  No deformity or scoliosis noted of thoracic or lumbar spine.   Pulses:  R and L carotid,radial,femoral,dorsalis pedis and posterior tibial pulses are full and equal bilaterally Extremities:  No clubbing, cyanosis, edema, or deformity noted with normal full range of motion of all joints.   Neurologic:  sensation intact to light touch, gait normal, and DTRs symmetrical and normal.   Skin:  Intact without suspicious lesions or rashes Cervical Nodes:  No lymphadenopathy noted Psych:  normal affect, talkative and pleasant  Impression & Recommendations:  Problem # 1:  PURE HYPERCHOLESTEROLEMIA (ICD-272.0) Assessment Deteriorated with good diet LDL still high disc genetic high chol trial of lipitor disc poss side eff lab 6 wk update if problems  adv to keep working on diet and exercise Her updated medication list for this problem includes:    Lipitor 10 Mg Tabs (Atorvastatin calcium) .Marland Kitchen... 1 by mouth once daily  Complete Medication List: 1)  One-a-day  Womens Formula Tabs (Multiple vitamins-calcium) .... Take 1 tablet by mouth once a day 2)  Vitamin D3 2000 Unit Tabs (Cholecalciferol) .... One tab once daily 3)  Vitamin B Complex-c Caps (B complex-c) .... Take 1 capsule by mouth once a day 4)  Calcium 600 600 Mg Tabs (Calcium carbonate) .... One tab once daily 5)  Chromium Picolinate 800 Mcg Tabs (Chromium picolinate) .... One tab once daily 6)  Calcium Carbonate-vitamin D 600-400 Mg-unit Tabs (Calcium carbonate-vitamin d) .... Take 1 tablet by mouth once a day 7)  Fish Oil 1000 Mg Caps (Omega-3 fatty acids) .... Take 1 capsule by mouth once a day 8)  Lexapro 20 Mg Tabs (Escitalopram oxalate) .... 1/2 by mouth once daily 9)  Lipitor 10 Mg Tabs (Atorvastatin calcium) .Marland Kitchen.. 1 by mouth once daily  Patient Instructions: 1)  you can raise your HDL (good cholesterol) by increasing exercise and eating omega 3 fatty acid supplement like fish oil or flax seed oil over the counter 2)  you can lower LDL (bad cholesterol) by limiting saturated fats in diet like red meat, fried foods, egg yolks, fatty breakfast meats, high fat dairy products and shellfish  3)  start lipitor 1 pill daily in evening  4)  schedule fasting labs in 6 weeks lipid/ast/alt 272  Prescriptions: LIPITOR 10 MG TABS (ATORVASTATIN CALCIUM) 1 by mouth once daily  #30 x 11   Entered and Authorized by:   Judith Part MD   Signed by:   Judith Part MD on 06/24/2010   Method used:   Print then Give to Patient   RxID:   [id number]    Orders Added: 1)  Est. Patient Level III [14782]    Current Allergies (reviewed today): No known allergies

## 2010-07-11 ENCOUNTER — Telehealth: Payer: Self-pay | Admitting: Family Medicine

## 2010-07-19 NOTE — Progress Notes (Signed)
Summary: Citalopram 20mg  and pt stopped Lipitor  Phone Note From Pharmacy Call back at [phone]   Caller: [facility] Call For: Cathey Endow MD  Summary of Call: [facility] sent electronic refill for Citalopram 20mg . take 1 1/2 tabs by mouth every day. Unable to reach pt. Celexa was removed from med list 12/30/09. [facility] said pt had been receiving monthly refills on Citalopram 20mg  and had not filled Lexapro at all. Was also told this is not the correct [facility] was instructed to call [facility]. Spoke with pharmacist at [facility]  and he verifed pt had been getting Citalopram 20mg  taking 1 1/2 tabs by mouth daily and no filling of Lexapro.Please advise.  Initial call taken by: Lewanda Rife LPN,  July 11, 2010 12:22 PM  Follow-up for Phone Call        if you would please just check in with pt to verify what she is taking -thanks Follow-up by: Judith Part MD,  July 11, 2010 12:45 PM  Additional Follow-up for Phone Call Additional follow up Details #1::        Left message for patient to call back. Lewanda Rife LPN  July 11, 4538 2:34 PM   Pt called back and pt is taking Citalopram 20mg  taking 1 1/2 tab mouth every day. Pt said she stopped in 12/2009 but decided she needed to go back on med. Pt thinks Citalopram is Lexapro. Pt spelled name of med so I'm sure she is takiing Citalopram 20mg . Pt also wanted Dr Milinda Antis to know that she started Lipitor 10mg  06/24/10 and stopped Lipitor after one week. Lipitor did not bother pt but pt was afraid of possible side effects. Pt has started back to weight watchers and lost 7.3 lbs this week.Pt said she is going to start back exercising again also. Please advise. Pt knows it will be Tues. before refills sent in . Pt still has med so she is not in hurry.Lewanda Rife LPN  July 10, 9809 3:04 PM     Additional Follow-up for Phone Call Additional follow up Details #2::    thanks for the update- will refil med  and take lipitor off her list  Follow-up by: Judith Part MD,  July 12, 2010 8:40 AM  Additional Follow-up for Phone Call Additional follow up Details #3:: Details for Additional Follow-up Action Taken: Patient notified as instructed by telephone. Medication phoned to [facility] on [address] pharmacy as instructed. Lewanda Rife LPN  July 11, 9145 8:47 AM   New/Updated Medications: CITALOPRAM HYDROBROMIDE 20 MG TABS (CITALOPRAM HYDROBROMIDE) 1 1/2 by mouth once daily Prescriptions: CITALOPRAM HYDROBROMIDE 20 MG TABS (CITALOPRAM HYDROBROMIDE) 1 1/2 by mouth once daily  #45 x 11   Entered and Authorized by:   Judith Part MD   Signed by:   Judith Part MD on 07/12/2010   Method used:   Telephoned to ...       [facility] (retail)       [address]       [place], [place]    [place]       Ph: [phone]       Fax: [phone]   RxID:   [id number]

## 2010-08-04 ENCOUNTER — Other Ambulatory Visit: Payer: Self-pay | Admitting: Family Medicine

## 2010-08-04 DIAGNOSIS — E7800 Pure hypercholesterolemia, unspecified: Secondary | ICD-10-CM

## 2010-08-05 ENCOUNTER — Other Ambulatory Visit (INDEPENDENT_AMBULATORY_CARE_PROVIDER_SITE_OTHER): Payer: BC Managed Care – PPO

## 2010-08-05 DIAGNOSIS — E7800 Pure hypercholesterolemia, unspecified: Secondary | ICD-10-CM

## 2010-08-05 LAB — LIPID PANEL
Cholesterol: 224 mg/dL — ABNORMAL HIGH (ref 0–200)
HDL: 74.1 mg/dL (ref >39.00)
Triglycerides: 50 mg/dL (ref 0.0–149.0)

## 2011-02-02 ENCOUNTER — Telehealth: Payer: Self-pay | Admitting: Family Medicine

## 2011-02-02 DIAGNOSIS — Z1231 Encounter for screening mammogram for malignant neoplasm of breast: Secondary | ICD-10-CM | POA: Insufficient documentation

## 2011-02-02 NOTE — Telephone Encounter (Signed)
Message copied by Judy Pimple on Thu Feb 02, 2011  9:12 PM ------      Message from: Carlton Adam      Created: Wed Feb 01, 2011  3:06 PM      Regarding: Please put screening mammo order in epic       Dr T, Pls put new screening MMG order in for this pt. She is scheduled for 03/15/2011 at 4:45pm. Shirlee Limerick

## 2011-02-23 ENCOUNTER — Other Ambulatory Visit: Payer: BC Managed Care – PPO

## 2011-02-27 ENCOUNTER — Ambulatory Visit: Payer: BC Managed Care – PPO | Admitting: Internal Medicine

## 2011-03-15 ENCOUNTER — Telehealth: Payer: Self-pay | Admitting: Family Medicine

## 2011-03-15 DIAGNOSIS — Z1231 Encounter for screening mammogram for malignant neoplasm of breast: Secondary | ICD-10-CM

## 2011-03-15 NOTE — Telephone Encounter (Signed)
[facility] requesting order for screening MMG for appt today. Patient hasnt been seen in over a year so they need an order from you faxed over to [phone].

## 2011-03-16 ENCOUNTER — Ambulatory Visit: Payer: Self-pay | Admitting: Family Medicine

## 2011-03-20 ENCOUNTER — Encounter: Payer: Self-pay | Admitting: Family Medicine

## 2011-03-24 ENCOUNTER — Encounter: Payer: Self-pay | Admitting: *Deleted

## 2011-09-01 ENCOUNTER — Other Ambulatory Visit: Payer: Self-pay | Admitting: Family Medicine

## 2011-09-01 NOTE — Telephone Encounter (Signed)
Will refill electronically  Looks like she needs follow up - please schedule when able (unless I am wrong about that )

## 2011-12-19 ENCOUNTER — Other Ambulatory Visit: Payer: Self-pay | Admitting: Family Medicine

## 2011-12-19 NOTE — Telephone Encounter (Signed)
Left message on patient VM to call and schedule a follow up appointment.

## 2011-12-19 NOTE — Telephone Encounter (Signed)
Request for Citalopram 20 mg. Last OV was over 1 year. Rx last filled 09/01/11. No future appointments. Ok to refill?

## 2011-12-19 NOTE — Telephone Encounter (Signed)
Will refill electronically Please schedule her appt to f/u with me, thanks

## 2012-04-26 ENCOUNTER — Ambulatory Visit: Payer: Self-pay | Admitting: Family Medicine

## 2012-04-26 ENCOUNTER — Encounter: Payer: Self-pay | Admitting: Family Medicine

## 2012-04-29 ENCOUNTER — Encounter: Payer: Self-pay | Admitting: *Deleted

## 2012-09-16 ENCOUNTER — Other Ambulatory Visit (INDEPENDENT_AMBULATORY_CARE_PROVIDER_SITE_OTHER): Payer: BC Managed Care – PPO

## 2012-09-16 ENCOUNTER — Telehealth: Payer: Self-pay | Admitting: Family Medicine

## 2012-09-16 DIAGNOSIS — E7800 Pure hypercholesterolemia, unspecified: Secondary | ICD-10-CM

## 2012-09-16 DIAGNOSIS — M899 Disorder of bone, unspecified: Secondary | ICD-10-CM

## 2012-09-16 DIAGNOSIS — Z0000 Encounter for general adult medical examination without abnormal findings: Secondary | ICD-10-CM | POA: Insufficient documentation

## 2012-09-16 DIAGNOSIS — M949 Disorder of cartilage, unspecified: Secondary | ICD-10-CM

## 2012-09-16 LAB — CBC WITH DIFFERENTIAL/PLATELET
Basophils Relative: 0.5 % (ref 0.0–3.0)
Eosinophils Relative: 0.8 % (ref 0.0–5.0)
HCT: 41.7 % (ref 36.0–46.0)
Hemoglobin: 14 g/dL (ref 12.0–15.0)
MCV: 91.5 fl (ref 78.0–100.0)
Monocytes Absolute: 0.3 10*3/uL (ref 0.1–1.0)
Neutro Abs: 1 10*3/uL — ABNORMAL LOW (ref 1.4–7.7)
Neutrophils Relative %: 34 % — ABNORMAL LOW (ref 43.0–77.0)
RBC: 4.56 Mil/uL (ref 3.87–5.11)
WBC: 3 10*3/uL — ABNORMAL LOW (ref 4.5–10.5)

## 2012-09-16 LAB — COMPREHENSIVE METABOLIC PANEL
ALT: 45 U/L — ABNORMAL HIGH (ref 0–35)
AST: 44 U/L — ABNORMAL HIGH (ref 0–37)
Albumin: 3.9 g/dL (ref 3.5–5.2)
Alkaline Phosphatase: 56 U/L (ref 39–117)
Calcium: 9.1 mg/dL (ref 8.4–10.5)
Chloride: 104 mEq/L (ref 96–112)
Creatinine, Ser: 0.6 mg/dL (ref 0.4–1.2)
Potassium: 3.9 mEq/L (ref 3.5–5.1)

## 2012-09-16 LAB — TSH: TSH: 2.14 u[IU]/mL (ref 0.35–5.50)

## 2012-09-16 LAB — LIPID PANEL
HDL: 65.2 mg/dL (ref >39.00)
LDL Cholesterol: 113 mg/dL — ABNORMAL HIGH (ref 0–99)
Total CHOL/HDL Ratio: 3
Triglycerides: 63 mg/dL (ref 0.0–149.0)

## 2012-09-16 NOTE — Telephone Encounter (Signed)
Message copied by Judy Pimple on Mon Sep 16, 2012 10:33 AM ------      Message from: Alvina Chou      Created: Mon Sep 16, 2012  9:19 AM      Regarding: labs for today       Patient is scheduled for CPX labs, please order future labs, Thanks , Terri       ------

## 2012-09-20 ENCOUNTER — Ambulatory Visit (INDEPENDENT_AMBULATORY_CARE_PROVIDER_SITE_OTHER): Payer: BC Managed Care – PPO | Admitting: Family Medicine

## 2012-09-20 ENCOUNTER — Other Ambulatory Visit (HOSPITAL_COMMUNITY)
Admission: RE | Admit: 2012-09-20 | Discharge: 2012-09-20 | Disposition: A | Discharge status: Home / Self Care | Payer: BC Managed Care – PPO | Source: Ambulatory Visit | Attending: Family Medicine | Admitting: Family Medicine

## 2012-09-20 ENCOUNTER — Encounter: Payer: Self-pay | Admitting: Family Medicine

## 2012-09-20 VITALS — BP 116/78 | HR 69 | Temp 98.9°F | Ht 64.25 in | Wt 183.8 lb

## 2012-09-20 DIAGNOSIS — E669 Obesity, unspecified: Secondary | ICD-10-CM

## 2012-09-20 DIAGNOSIS — E7800 Pure hypercholesterolemia, unspecified: Secondary | ICD-10-CM

## 2012-09-20 DIAGNOSIS — Z0000 Encounter for general adult medical examination without abnormal findings: Secondary | ICD-10-CM

## 2012-09-20 DIAGNOSIS — Z01419 Encounter for gynecological examination (general) (routine) without abnormal findings: Secondary | ICD-10-CM

## 2012-09-20 DIAGNOSIS — M899 Disorder of bone, unspecified: Secondary | ICD-10-CM

## 2012-09-20 DIAGNOSIS — Z1151 Encounter for screening for human papillomavirus (HPV): Secondary | ICD-10-CM | POA: Insufficient documentation

## 2012-09-20 NOTE — Assessment & Plan Note (Signed)
Improved with better diet  Disc goals for lipids and reasons to control them Rev labs with pt Rev low sat fat diet in detail  Commended

## 2012-09-20 NOTE — Assessment & Plan Note (Signed)
Reviewed health habits including diet and exercise and skin cancer prevention Also reviewed health mt list, fam hx and immunizations  Wellness labs rev  Baseline wbc in 3s- will watch that Chol improved Baseline slt inc ast/alt-watching (no etoh or acetaminophen)

## 2012-09-20 NOTE — Assessment & Plan Note (Signed)
Reviewed health habits including diet and exercise and skin cancer prevention Also reviewed health mt list, fam hx and immunizations   Commended on large weight loss and good habits Rev wellness labs in detail

## 2012-09-20 NOTE — Patient Instructions (Signed)
Labs look ok- cholesterol is ok - keep working on it  Minimize red meat and fatty foods  Pap and pelvic exam today  See Shirlee Limerick at check out to schedule your bone density test  Keep up the good work with weight loss!

## 2012-09-20 NOTE — Progress Notes (Signed)
Subjective:    Patient ID: Emma Knight, female    DOB: [birth year], 57 y.o.   MRN: [id number]  HPI Here for health maintenance exam and to review chronic medical problems    Wt is down 61 lb since last visit ! Made a big diet change- got rid of junk/ sugar and much of her starch - drinks lots and lots of water  Eats lots of protien/- meat and eggs and greek yogurt  This really works for her  Walking for exercise  Most of her starches are fruits and vegetables   Is very very proud! And feels good and plans to loose more   Pap 8/10 Has had some abn paps a long time ago-that cleared  No gyn problems or c/o   colonosc 8.09 with recall at 10 y- was nl   dexa 8/10 osteopenia  D level nl at 44- she is good about her supplements  Falls/ fx-- none at all   Flu vaccine- did not get this season - just forgot   mammo 12/13  Self exam-no lumps or changes   Td 1/05  Lab Results  Component Value Date   CHOL 191 09/16/2012   CHOL 224* 08/05/2010   CHOL 253* 06/14/2010   Lab Results  Component Value Date   HDL 65.20 09/16/2012   HDL 14.78 08/05/2010   HDL 29.56 06/14/2010   Lab Results  Component Value Date   LDLCALC 113* 09/16/2012   Lab Results  Component Value Date   TRIG 63.0 09/16/2012   TRIG 50.0 08/05/2010   TRIG 80.0 06/14/2010   Lab Results  Component Value Date   CHOLHDL 3 09/16/2012   CHOLHDL 3 08/05/2010   CHOLHDL 3 06/14/2010   Lab Results  Component Value Date   LDLDIRECT 130.7 08/05/2010   LDLDIRECT 157.7 06/14/2010   LDLDIRECT 153.9 03/08/2010   doing much better with cholesterol  She was px a statin - and then decided not to take it - happy with that     Lab Results  Component Value Date   ALT 45* 09/16/2012   AST 44* 09/16/2012   ALKPHOS 56 09/16/2012   BILITOT 0.5 09/16/2012   no alcohol or tylenol  Mood is good - is off her celexa now   Patient Active Problem List   Diagnosis Date Noted  . Routine general medical examination at a health care facility  09/16/2012  . Other screening mammogram 02/02/2011  . OBSTRUCTIVE SLEEP APNEA 02/16/2010  . OBESITY 12/30/2009  . PURE HYPERCHOLESTEROLEMIA 03/03/2009  . DEPRESSION 05/20/2008  . OSTEOPENIA 05/20/2008   No past medical history on file. No past surgical history on file. History  Substance Use Topics  . Smoking status: Never Smoker   . Smokeless tobacco: Not on file  . Alcohol Use: No   No family history on file. No Known Allergies No current outpatient prescriptions on file prior to visit.   No current facility-administered medications on file prior to visit.     Review of Systems Review of Systems  Constitutional: Negative for fever, appetite change, fatigue and unexpected weight change.  Eyes: Negative for pain and visual disturbance.  Respiratory: Negative for cough and shortness of breath.   Cardiovascular: Negative for cp or palpitations    Gastrointestinal: Negative for nausea, diarrhea and constipation.  Genitourinary: Negative for urgency and frequency.  Skin: Negative for pallor or rash   Neurological: Negative for weakness, light-headedness, numbness and headaches.  Hematological: Negative for adenopathy. Does not bruise/bleed easily.  Psychiatric/Behavioral: Negative for dysphoric mood. The patient is not nervous/anxious.         Objective:   Physical Exam  Constitutional: She appears well-developed and well-nourished. No distress.  obese and well appearing   Wt loss noted   HENT:  Head: Normocephalic and atraumatic.  Right Ear: External ear normal.  Left Ear: External ear normal.  Nose: Nose normal.  Mouth/Throat: Oropharynx is clear and moist.  Eyes: Conjunctivae and EOM are normal. Pupils are equal, round, and reactive to light. Right eye exhibits no discharge. Left eye exhibits no discharge. No scleral icterus.  Neck: Normal range of motion. Neck supple. No JVD present. Carotid bruit is not present. No thyromegaly present.  Cardiovascular: Normal  rate, regular rhythm, normal heart sounds and intact distal pulses.  Exam reveals no gallop.   Pulmonary/Chest: Effort normal and breath sounds normal. No respiratory distress. She has no wheezes. She exhibits no tenderness.  Abdominal: Soft. Bowel sounds are normal. She exhibits no distension, no abdominal bruit and no mass. There is no tenderness.  Genitourinary: Vagina normal and uterus normal. No breast swelling, tenderness, discharge or bleeding. There is no rash, tenderness or lesion on the right labia. There is no rash, tenderness or lesion on the left labia. Uterus is not enlarged and not tender. Cervix exhibits no motion tenderness, no discharge and no friability. Right adnexum displays no mass, no tenderness and no fullness. Left adnexum displays no mass, no tenderness and no fullness. No bleeding around the vagina. No vaginal discharge found.  Breast exam: No mass, nodules, thickening, tenderness, bulging, retraction, inflamation, nipple discharge or skin changes noted.  No axillary or clavicular LA.  Chaperoned exam.    Musculoskeletal: She exhibits no edema and no tenderness.  Lymphadenopathy:    She has no cervical adenopathy.  Neurological: She is alert. She has normal reflexes. No cranial nerve deficit. She exhibits normal muscle tone. Coordination normal.  Skin: Skin is warm and dry. No rash noted. No erythema. No pallor.  Psychiatric: She has a normal mood and affect.          Assessment & Plan:

## 2012-09-20 NOTE — Assessment & Plan Note (Signed)
Commended on great work with wt loss so far  Discussed how this problem influences overall health and the risks it imposes  Reviewed plan for weight loss with lower calorie diet (via better food choices and also portion control or program like weight watchers) and exercise building up to or more than 30 minutes 5 days per week including some aerobic activity

## 2012-09-20 NOTE — Assessment & Plan Note (Signed)
Due for dexa Good D level and pt does exercise No falls or fx

## 2012-09-24 ENCOUNTER — Ambulatory Visit: Payer: Self-pay | Admitting: Family Medicine

## 2012-09-24 ENCOUNTER — Encounter: Payer: Self-pay | Admitting: Family Medicine

## 2012-09-24 LAB — HM DEXA SCAN

## 2012-09-25 ENCOUNTER — Encounter: Payer: Self-pay | Admitting: Family Medicine

## 2012-09-25 ENCOUNTER — Ambulatory Visit (INDEPENDENT_AMBULATORY_CARE_PROVIDER_SITE_OTHER): Payer: BC Managed Care – PPO | Admitting: Family Medicine

## 2012-09-25 ENCOUNTER — Encounter: Payer: Self-pay | Admitting: *Deleted

## 2012-09-25 ENCOUNTER — Telehealth: Payer: Self-pay | Admitting: Family Medicine

## 2012-09-25 VITALS — BP 120/72 | HR 68 | Temp 98.3°F | Ht 64.25 in | Wt 186.0 lb

## 2012-09-25 DIAGNOSIS — R350 Frequency of micturition: Secondary | ICD-10-CM

## 2012-09-25 DIAGNOSIS — N390 Urinary tract infection, site not specified: Secondary | ICD-10-CM

## 2012-09-25 LAB — POCT URINALYSIS DIPSTICK
Ketones, UA: NEGATIVE
Protein, UA: NEGATIVE
Spec Grav, UA: 1.025
Urobilinogen, UA: NEGATIVE
pH, UA: 6

## 2012-09-25 MED ORDER — NITROFURANTOIN MONOHYD MACRO 100 MG PO CAPS: 100.0000 mg | ORAL_CAPSULE | Freq: Two times a day (BID) | ORAL | Status: DC

## 2012-09-25 NOTE — Progress Notes (Signed)
Nature conservation officer at [facility] [address] [place] [address] Phone: [phone] Fax: [phone]  Date:  09/25/2012   Name:  Emma Knight   DOB:  1955-12-25   MRN:  [id number] Gender: female Age: 57 y.o.  Primary Physician:  Roxy Manns, MD  Evaluating MD: Hannah Beat, MD   Chief Complaint: Urinary Tract Infection   History of Present Illness:  Emma Knight is a 57 y.o. pleasant patient who presents with the following:  Woke up this morning, and this morning, felt a lot of need to go to the different. Water was pink. Did not feel right in suprapubic area. Urgency  Patient Active Problem List   Diagnosis Date Noted  . Encounter for routine gynecological examination 09/20/2012  . Routine general medical examination at a health care facility 09/16/2012  . Other screening mammogram 02/02/2011  . OBSTRUCTIVE SLEEP APNEA 02/16/2010  . OBESITY 12/30/2009  . PURE HYPERCHOLESTEROLEMIA 03/03/2009  . OSTEOPENIA 05/20/2008    No past medical history on file.  No past surgical history on file.  History   Social History  . Marital Status: Married    Spouse Name: N/A    Number of Children: N/A  . Years of Education: N/A   Occupational History  . Not on file.   Social History Main Topics  . Smoking status: Never Smoker   . Smokeless tobacco: Not on file  . Alcohol Use: No  . Drug Use: No  . Sexually Active: Not on file   Other Topics Concern  . Not on file   Social History Narrative  . No narrative on file    No family history on file.  No Known Allergies  Medication list has been reviewed and updated.  No outpatient prescriptions prior to visit.   No facility-administered medications prior to visit.    Review of Systems:  ROS: GEN: Acute illness details above GI: Tolerating PO intake GU: maintaining adequate hydration and urination Pulm: No SOB Interactive and getting along well at home.  Otherwise, ROS is as per the HPI.    Physical Examination: BP 120/72  Pulse 68  Temp(Src) 98.3 F (36.8 C) (Oral)  Ht 5' 4.25" (1.632 m)  Wt 186 lb (84.369 kg)  BMI 31.68 kg/m2  Ideal Body Weight: Weight in (lb) to have BMI = 25: 146.5   GEN: WDWN, A&Ox4,NAD. Non-toxic HEENT: Atraumatc, normocephalic. CV: RRR, No M/G/R PULM: CTA B, No wheezes, crackles, or rhonchi ABD: S, NT, ND, +BS, no rebound. No CVAT. No suprapubic tenderness. EXT: No c/c/e   Assessment and Plan:  UTI (urinary tract infection) - Plan: Urine culture  Urinary frequency - Plan: POCT Urinalysis Dipstick, Urine culture  cx and abx  Results for orders placed in visit on 09/25/12  POCT URINALYSIS DIPSTICK      Result Value Range   Color, UA yellow     Clarity, UA clear     Glucose, UA neg     Bilirubin, UA neg     Ketones, UA neg     Spec Grav, UA 1.025     Blood, UA large     pH, UA 6.0     Protein, UA neg     Urobilinogen, UA negative     Nitrite, UA neg     Leukocytes, UA small (1+)       Orders Today:  Orders Placed This Encounter  Procedures  . Urine culture  . POCT Urinalysis Dipstick    Updated Medication  List: (Includes new medications, updates to list, dose adjustments) Meds ordered this encounter  Medications  . nitrofurantoin, macrocrystal-monohydrate, (MACROBID) 100 MG capsule    Sig: Take 1 capsule (100 mg total) by mouth 2 (two) times daily.    Dispense:  14 capsule    Refill:  0    Medications Discontinued: There are no discontinued medications.    Signed, Elpidio Galea. Wilson Dusenbery, MD 09/25/2012 12:32 PM

## 2012-09-25 NOTE — Telephone Encounter (Signed)
Patient Information:  Caller Name: Deyani  Phone: [phone]  Patient: Emma Knight  Gender: Female  DOB: 06-29-1955  Age: 57 Years  PCP: Tower, [employer] [facility])  Office Follow Up:  Does the office need to follow up with this patient?: No  Instructions For The Office: N/A  RN Note:  No appts available with Dr. Milinda Antis, appt scheduled with Dr. Dallas Schimke for today.    Symptoms  Reason For Call & Symptoms: Pt calling today 09/25/12 regarding having urinary frequency and did have some pink tinged urine when she urinated.  Reviewed Health History In EMR: Yes  Reviewed Medications In EMR: Yes  Reviewed Allergies In EMR: Yes  Reviewed Surgeries / Procedures: Yes  Date of Onset of Symptoms: 09/25/2012  Guideline(s) Used:  Urination Pain - Female  Disposition Per Guideline:   See Today in Office  Reason For Disposition Reached:   Age > 50 years  Advice Given:  N/A  Patient Will Follow Care Advice:  YES  Appointment Scheduled:  09/25/2012 12:15:00 Appointment Scheduled Provider:  Hannah Beat (Family Practice)

## 2012-09-26 ENCOUNTER — Encounter: Payer: Self-pay | Admitting: Family Medicine

## 2012-09-27 LAB — URINE CULTURE

## 2013-01-09 ENCOUNTER — Other Ambulatory Visit: Payer: BC Managed Care – PPO

## 2013-01-15 ENCOUNTER — Encounter: Payer: BC Managed Care – PPO | Admitting: Family Medicine

## 2013-03-13 ENCOUNTER — Other Ambulatory Visit: Payer: Self-pay

## 2013-10-22 DIAGNOSIS — E881 Lipodystrophy, not elsewhere classified: Secondary | ICD-10-CM | POA: Insufficient documentation

## 2013-10-22 DIAGNOSIS — R634 Abnormal weight loss: Secondary | ICD-10-CM | POA: Insufficient documentation

## 2014-03-18 ENCOUNTER — Encounter: Payer: Self-pay | Admitting: Family Medicine

## 2014-03-18 ENCOUNTER — Ambulatory Visit (INDEPENDENT_AMBULATORY_CARE_PROVIDER_SITE_OTHER): Payer: BC Managed Care – PPO | Admitting: Family Medicine

## 2014-03-18 VITALS — BP 128/70 | HR 54 | Temp 98.9°F | Ht 64.25 in | Wt 167.0 lb

## 2014-03-18 DIAGNOSIS — E7800 Pure hypercholesterolemia, unspecified: Secondary | ICD-10-CM

## 2014-03-18 DIAGNOSIS — Z23 Encounter for immunization: Secondary | ICD-10-CM

## 2014-03-18 DIAGNOSIS — Z01818 Encounter for other preprocedural examination: Secondary | ICD-10-CM

## 2014-03-18 DIAGNOSIS — Z01810 Encounter for preprocedural cardiovascular examination: Secondary | ICD-10-CM

## 2014-03-18 DIAGNOSIS — Z806 Family history of leukemia: Secondary | ICD-10-CM

## 2014-03-18 LAB — COMPREHENSIVE METABOLIC PANEL
ALBUMIN: 3.7 g/dL (ref 3.5–5.2)
ALT: 26 U/L (ref 0–35)
AST: 22 U/L (ref 0–37)
Alkaline Phosphatase: 55 U/L (ref 39–117)
BUN: 15 mg/dL (ref 6–23)
CALCIUM: 9.1 mg/dL (ref 8.4–10.5)
CHLORIDE: 105 meq/L (ref 96–112)
CO2: 28 mEq/L (ref 19–32)
Creatinine, Ser: 0.6 mg/dL (ref 0.4–1.2)
GFR: 117.47 mL/min (ref >60.00)
Glucose, Bld: 90 mg/dL (ref 70–99)
POTASSIUM: 4.2 meq/L (ref 3.5–5.1)
Sodium: 138 mEq/L (ref 135–145)
TOTAL PROTEIN: 6.8 g/dL (ref 6.0–8.3)
Total Bilirubin: 0.5 mg/dL (ref 0.2–1.2)

## 2014-03-18 LAB — CBC WITH DIFFERENTIAL/PLATELET
BASOS ABS: 0 10*3/uL (ref 0.0–0.1)
Basophils Relative: 0.6 % (ref 0.0–3.0)
EOS ABS: 0 10*3/uL (ref 0.0–0.7)
Eosinophils Relative: 1.2 % (ref 0.0–5.0)
HCT: 40.5 % (ref 36.0–46.0)
Hemoglobin: 13.3 g/dL (ref 12.0–15.0)
Lymphocytes Relative: 48.2 % — ABNORMAL HIGH (ref 12.0–46.0)
Lymphs Abs: 1.7 10*3/uL (ref 0.7–4.0)
MCHC: 32.9 g/dL (ref 30.0–36.0)
MCV: 93.2 fl (ref 78.0–100.0)
Monocytes Absolute: 0.3 10*3/uL (ref 0.1–1.0)
Monocytes Relative: 8.2 % (ref 3.0–12.0)
NEUTROS PCT: 41.8 % — AB (ref 43.0–77.0)
Neutro Abs: 1.5 10*3/uL (ref 1.4–7.7)
Platelets: 197 10*3/uL (ref 150.0–400.0)
RBC: 4.34 Mil/uL (ref 3.87–5.11)
RDW: 14.1 % (ref 11.5–15.5)
WBC: 3.6 10*3/uL — ABNORMAL LOW (ref 4.0–10.5)

## 2014-03-18 LAB — LIPID PANEL
Cholesterol: 244 mg/dL — ABNORMAL HIGH (ref 0–200)
HDL: 82 mg/dL (ref >39.00)
LDL Cholesterol: 154 mg/dL — ABNORMAL HIGH (ref 0–99)
NonHDL: 162
TRIGLYCERIDES: 38 mg/dL (ref 0.0–149.0)
Total CHOL/HDL Ratio: 3
VLDL: 7.6 mg/dL (ref 0.0–40.0)

## 2014-03-18 NOTE — Assessment & Plan Note (Signed)
Cbc with diff today Nl exam and no clinical changes

## 2014-03-18 NOTE — Progress Notes (Signed)
Subjective:    Patient ID: Emma Knight, female    DOB: [birth year], 58 y.o.   MRN: [id number]  HPI Here for preop eval  And to check a few skin spots on L arm and right hand   White areas- scaley and she is able to scratch them off  One spot on R hand bled   She has it planned next Tuesday - with Dr Salli Quarryetlev Erdmann  For excess skin removal from arms  Excited about this   No hx of anesthesia problems  No med allergies  No bp or heart problems  No pulmonary problems   Wt loss- 19 more lb by our scales Per pt she got down to 150 and then gained some back  She is eating right -doing well with that (avoids bad carbs and process foods)   She is also exercising- dances a few times per week - line dancing and shag  Stays active   No longer has sleep apnea (was told she snored but did not have apnea)- she does snore some  Sleeps well   Mammogram - within the past year  [facility]   [facility]osc- ? [facility] the [facility] y [facility]  Needs electolye panel (cmt)   Family hx of blood cancers-she would like a cbc   Father - myeloid leukemia   Hx of hyperlipidemia Lab Results  Component Value Date   CHOL 191 09/16/2012   HDL 65.20 09/16/2012   LDLCALC 113* 09/16/2012   LDLDIRECT 130.7 08/05/2010   TRIG 63.0 09/16/2012   CHOLHDL 3 09/16/2012    Her last tetanus shot 9/05  Had her flu shot 2 weeks ago   Patient Active Problem List   Diagnosis Date Noted  . Encounter for routine gynecological examination 09/20/2012  . Routine general medical examination at a health care facility 09/16/2012  . Other screening mammogram 02/02/2011  . OBSTRUCTIVE SLEEP APNEA 02/16/2010  . OBESITY 12/30/2009  . PURE HYPERCHOLESTEROLEMIA 03/03/2009  . OSTEOPENIA 05/20/2008   Past Medical History  Diagnosis Date  . Sleep apnea   . Hyperlipidemia   . Osteopenia    No past surgical history on file. History  Substance Use Topics  . Smoking status: Never Smoker   .  Smokeless tobacco: Not on file  . Alcohol Use: No   No family history on file. No Known Allergies No current outpatient prescriptions on file prior to visit.   No current facility-administered medications on file prior to visit.     Review of Systems Review of Systems  Constitutional: Negative for fever, appetite change, fatigue and unexpected weight change.  Eyes: Negative for pain and visual disturbance.  Respiratory: Negative for cough and shortness of breath.   Cardiovascular: Negative for cp or palpitations    Gastrointestinal: Negative for nausea, diarrhea and constipation.  Genitourinary: Negative for urgency and frequency.  Skin: Negative for pallor or rash   Neurological: Negative for weakness, light-headedness, numbness and headaches.  Hematological: Negative for adenopathy. Does not bruise/bleed easily.  Psychiatric/Behavioral: Negative for dysphoric mood. The patient is not nervous/anxious.         Objective:   Physical Exam  Constitutional: She appears well-developed and well-nourished. No distress.  Well appearing   Weight loss noted   HENT:  Head: Normocephalic and atraumatic.  Mouth/Throat: Oropharynx is clear and moist.  Eyes: Conjunctivae and EOM are normal. Pupils are equal, round, and reactive to light. Right eye exhibits no discharge. Left eye  exhibits no discharge. No scleral icterus.  Neck: Normal range of motion. Neck supple. No JVD present. No thyromegaly present.  Cardiovascular: Normal rate, regular rhythm, normal heart sounds and intact distal pulses.  Exam reveals no gallop.   Pulmonary/Chest: Effort normal and breath sounds normal. No respiratory distress. She has no wheezes. She has no rales.  Abdominal: Soft. Bowel sounds are normal. She exhibits no distension and no mass. There is no tenderness.  Musculoskeletal: She exhibits no edema or tenderness.  No kyphosis   Lymphadenopathy:    She has no cervical adenopathy.  Neurological: She is  alert. She has normal reflexes. No cranial nerve deficit. She exhibits normal muscle tone. Coordination normal.  Skin: Skin is warm and dry. No rash noted. No erythema. No pallor.  .5 cm area on L forearm- with pink scale -consistent with AK or dermatitis   Psychiatric: She has a normal mood and affect.          Assessment & Plan:   Problem List Items Addressed This Visit      Other   Family history of leukemia    Cbc with diff today Nl exam and no clinical changes     Relevant Orders      CBC with Differential (Completed)   Preop examination    EKG rate 53 with no acute changes No cardiac/ pulm problems No longer has sleep apnea with wt loss  surg planned -plastic surg to remove excess skin from arms  No med allergies cmet today  Overall suspect she is low operative risk   Did update Tdap today      Relevant Orders      Comprehensive metabolic panel (Completed)   PURE HYPERCHOLESTEROLEMIA    Lab today  Expect further imp with excellent diet Rev low sat fat diet     Relevant Orders      Lipid panel (Completed)    Other Visit Diagnoses    Pre-operative cardiovascular examination    -  Primary    Relevant Orders       EKG 12-Lead (Completed)    Need for Tdap vaccination        Relevant Orders       Tdap vaccine greater than or equal to 7yo IM (Completed)

## 2014-03-18 NOTE — Progress Notes (Signed)
Pre visit review using our clinic review tool, if applicable. No additional management support is needed unless otherwise documented below in the visit note. 

## 2014-03-18 NOTE — Assessment & Plan Note (Signed)
EKG rate 53 with no acute changes No cardiac/ pulm problems No longer has sleep apnea with wt loss  surg planned -plastic surg to remove excess skin from arms  No med allergies cmet today  Overall suspect she is low operative risk   Did update Tdap today

## 2014-03-18 NOTE — Patient Instructions (Signed)
Please request last mammogram report from [facility] Also request last colonoscopy report from [facility]   Labs today  Tdap vaccine today

## 2014-03-18 NOTE — Assessment & Plan Note (Signed)
Lab today  Expect further imp with excellent diet Rev low sat fat diet

## 2014-03-23 ENCOUNTER — Ambulatory Visit: Payer: Self-pay | Admitting: Family Medicine

## 2014-03-24 ENCOUNTER — Encounter: Payer: Self-pay | Admitting: Family Medicine

## 2014-03-26 ENCOUNTER — Encounter: Payer: Self-pay | Admitting: Family Medicine

## 2014-04-01 ENCOUNTER — Encounter: Payer: Self-pay | Admitting: Family Medicine

## 2014-04-01 ENCOUNTER — Ambulatory Visit: Payer: Self-pay | Admitting: Family Medicine

## 2014-09-14 ENCOUNTER — Ambulatory Visit (INDEPENDENT_AMBULATORY_CARE_PROVIDER_SITE_OTHER): Payer: BC Managed Care – PPO | Admitting: Family Medicine

## 2014-09-14 ENCOUNTER — Encounter: Payer: Self-pay | Admitting: Family Medicine

## 2014-09-14 VITALS — BP 128/82 | HR 69 | Temp 99.2°F | Ht 64.25 in | Wt 165.5 lb

## 2014-09-14 DIAGNOSIS — J069 Acute upper respiratory infection, unspecified: Secondary | ICD-10-CM | POA: Insufficient documentation

## 2014-09-14 DIAGNOSIS — B9789 Other viral agents as the cause of diseases classified elsewhere: Principal | ICD-10-CM

## 2014-09-14 NOTE — Assessment & Plan Note (Signed)
Re assuring exam  Suspect she is in the 2nd half of virus  Disc symptomatic care - see instructions on AVS  Update if not starting to improve in a week or if worsening

## 2014-09-14 NOTE — Progress Notes (Signed)
Pre visit review using our clinic review tool, if applicable. No additional management support is needed unless otherwise documented below in the visit note. 

## 2014-09-14 NOTE — Progress Notes (Signed)
Subjective:    Patient ID: Emma Knight, female    DOB: 12/31/1955, 59 y.o.   MRN: [id number]  HPI Here with cough and congestion   Post nasal drip  Fatigue / under the weather   This started mid last week  Felt like she was coming down with a cold - used alka selzer plus  Then claritin and mucinex   Nasal drainage - clear  Chest cong - phlegm - yellow  Some mild wheezing once when she lay down   No fever that she knows of - once got sweaty in the beginning   Ears and throat are fine   Sleeps ok -not coughing all night   Patient Active Problem List   Diagnosis Date Noted  . Preop examination 03/18/2014  . Family history of leukemia 03/18/2014  . Encounter for routine gynecological examination 09/20/2012  . Routine general medical examination at a health care facility 09/16/2012  . Other screening mammogram 02/02/2011  . OBESITY 12/30/2009  . PURE HYPERCHOLESTEROLEMIA 03/03/2009  . OSTEOPENIA 05/20/2008   Past Medical History  Diagnosis Date  . Sleep apnea   . Hyperlipidemia   . Osteopenia    No past surgical history on file. History  Substance Use Topics  . Smoking status: Never Smoker   . Smokeless tobacco: Not on file  . Alcohol Use: No   No family history on file. No Known Allergies No current outpatient prescriptions on file prior to visit.   No current facility-administered medications on file prior to visit.      Review of Systems Review of Systems  Constitutional: Negative for fever, appetite change, and unexpected weight change. pos for fatigue ENT pos for cong and rhinorrhea , neg for sinus tenderness Eyes: Negative for pain and visual disturbance.  Respiratory: Negative for shortness of breath.   Cardiovascular: Negative for cp or palpitations    Gastrointestinal: Negative for nausea, diarrhea and constipation.  Genitourinary: Negative for urgency and frequency.  Skin: Negative for pallor or rash   Neurological: Negative for weakness,  light-headedness, numbness and headaches.  Hematological: Negative for adenopathy. Does not bruise/bleed easily.  Psychiatric/Behavioral: Negative for dysphoric mood. The patient is not nervous/anxious.         Objective:   Physical Exam  Constitutional: She appears well-developed and well-nourished. No distress.  overwt and well app  HENT:  Head: Normocephalic and atraumatic.  Right Ear: External ear normal.  Left Ear: External ear normal.  Mouth/Throat: Oropharynx is clear and moist.  Nares are injected and congested  No sinus tenderness Clear rhinorrhea and post nasal drip   Eyes: Conjunctivae and EOM are normal. Pupils are equal, round, and reactive to light. Right eye exhibits no discharge. Left eye exhibits no discharge.  Neck: Normal range of motion. Neck supple.  Cardiovascular: Normal rate and normal heart sounds.   Pulmonary/Chest: Effort normal and breath sounds normal. No respiratory distress. She has no wheezes. She has no rales. She exhibits no tenderness.  Lymphadenopathy:    She has no cervical adenopathy.  Neurological: She is alert.  Skin: Skin is warm and dry. No rash noted.  Psychiatric: She has a normal mood and affect.          Assessment & Plan:   Problem List Items Addressed This Visit    Viral URI with cough - Primary    Re assuring exam  Suspect she is in the 2nd half of virus  Disc symptomatic care - see instructions on AVS  Update if not starting to improve in a week or if worsening

## 2014-09-14 NOTE — Patient Instructions (Signed)
Lungs sound good Continue current symptomatic medicines  Drink lots of fluids and get extra rest  Update if not starting to improve in a week or if worsening

## 2014-12-16 ENCOUNTER — Telehealth: Payer: Self-pay | Admitting: Family Medicine

## 2014-12-16 NOTE — Telephone Encounter (Signed)
Pt needs a letter stating that she needs a letter of the last time she was prescribed lexapro or other antidepressant drugs.  This is for her insurance company so that she can get a lower premium for life insurance. They told her that 3 years after she went off the antidepressant her rates can go down.  thanks cb number [phone].

## 2014-12-16 NOTE — Telephone Encounter (Signed)
I will route to [facility] and see what she says about it - can we do her labs here?

## 2014-12-16 NOTE — Telephone Encounter (Signed)
Pt said doctor didn't give her any orders and I spoke with the nurse Jan at the office and they don't give pt lab orders at all because they want their PCP's to be in charge of the labs and not them because if any thing is abnormal on the labs they want their PCP to be over the treatment since all they do is cosmetic surgery

## 2014-12-16 NOTE — Telephone Encounter (Signed)
Letter mailed per pt request.  Pt wanted me to ask Dr. Milinda Antis if it's okay for her to have labs done here. Pt is having a Thigh Lift in Oct and they are requesting she has a PT, PTT lab done before then. Since it's an elective surgery they don't do lab work at that office because if something is abnormal they want the PCP to be in charge of it. Pt is requesting to schedule a lab appt and have the PT, PTT done, is it okay to schedule, please advise

## 2014-12-16 NOTE — Telephone Encounter (Signed)
We are not allowed to labs for other doctors -it's a legal thing -she will have to go to [facility] or another drawing center with her order  Sorry about that

## 2014-12-16 NOTE — Telephone Encounter (Signed)
According to our records (this EMR goes back to 2012) the last time we px citalopram was 8/13  I will print the letter

## 2014-12-17 NOTE — Telephone Encounter (Signed)
That sounds great-please schedule lab appt for PT/PTT   (let me know if she needs anything else) Dx will be pre operative evaluation  Thanks

## 2014-12-17 NOTE — Telephone Encounter (Signed)
appt scheduled

## 2014-12-17 NOTE — Telephone Encounter (Signed)
I think in this circumstance, we can draw the labs since the labs are in our scope of practice. These will not be oncology labs or anything else.  These are primary care type labs.  If doctor Tower agrees, then I say it's okay.

## 2014-12-21 ENCOUNTER — Other Ambulatory Visit (INDEPENDENT_AMBULATORY_CARE_PROVIDER_SITE_OTHER): Payer: BC Managed Care – PPO

## 2014-12-21 DIAGNOSIS — Z01818 Encounter for other preprocedural examination: Secondary | ICD-10-CM | POA: Diagnosis not present

## 2014-12-21 LAB — APTT: APTT: 32.4 s (ref 23.4–32.7)

## 2014-12-21 LAB — PROTIME-INR
INR: 1 ratio (ref 0.8–1.0)
Prothrombin Time: 11 s (ref 9.6–13.1)

## 2015-01-08 ENCOUNTER — Telehealth: Payer: Self-pay | Admitting: Family Medicine

## 2015-01-08 ENCOUNTER — Encounter: Payer: Self-pay | Admitting: Family Medicine

## 2015-01-08 NOTE — Telephone Encounter (Signed)
This is a Physicist, medical for [employer] company  Please mail it to her or have her pick it up  In IN box, Thanks

## 2015-01-12 NOTE — Telephone Encounter (Signed)
Left voicemail letting pt know letter ready for pick-up and if she wants [place] to mail it to call back and let us know

## 2015-01-12 NOTE — Telephone Encounter (Signed)
Pt called back asking [place] to mail letter.  Letter was mailed to home address 01/12/15

## 2015-02-08 ENCOUNTER — Telehealth: Payer: Self-pay

## 2015-02-08 NOTE — Telephone Encounter (Signed)
Pt request last BP reading; BP was 128/82 on 09/14/14; pt voiced understanding.

## 2015-06-09 ENCOUNTER — Encounter: Payer: Self-pay | Admitting: Family Medicine

## 2015-06-09 ENCOUNTER — Ambulatory Visit (INDEPENDENT_AMBULATORY_CARE_PROVIDER_SITE_OTHER): Payer: BC Managed Care – PPO | Admitting: Family Medicine

## 2015-06-09 VITALS — BP 120/86 | HR 96 | Temp 102.0°F | Wt 159.2 lb

## 2015-06-09 DIAGNOSIS — R509 Fever, unspecified: Secondary | ICD-10-CM | POA: Diagnosis not present

## 2015-06-09 DIAGNOSIS — R6889 Other general symptoms and signs: Secondary | ICD-10-CM | POA: Diagnosis not present

## 2015-06-09 LAB — POCT INFLUENZA A/B
INFLUENZA A, POC: NEGATIVE
INFLUENZA B, POC: NEGATIVE

## 2015-06-09 MED ORDER — OSELTAMIVIR PHOSPHATE 75 MG PO CAPS: 75.0000 mg | ORAL_CAPSULE | Freq: Two times a day (BID) | ORAL | Status: DC

## 2015-06-09 MED ORDER — BENZONATATE 200 MG PO CAPS: 200.0000 mg | ORAL_CAPSULE | Freq: Three times a day (TID) | ORAL | Status: DC | PRN

## 2015-06-09 NOTE — Progress Notes (Signed)
Pre visit review using our clinic review tool, if applicable. No additional management support is needed unless otherwise documented below in the visit note.  Sx started last night with a cough.  Cough worse in the meantime.  Fever up to 102, here at [facility].  Sick contact noted recently, presumed flu.  Muscles aching.  No vomiting, no diarrhea.  No sputum.  Not SOB.  Fatigued.  No ear pain.  No ST.  Some rhinorrhea.  Had a flu shot.   Flu test neg at [facility].    Meds, vitals, and allergies reviewed.   ROS: See HPI.  Otherwise, noncontributory.  GEN: nad, alert and oriented HEENT: mucous membranes moist, tm w/o erythema, nasal exam w/o erythema, clear discharge noted,  OP with cobblestoning NECK: supple w/o LA CV: rrr.   PULM: ctab, no inc wob EXT: no edema SKIN: no acute rash

## 2015-06-09 NOTE — Patient Instructions (Signed)
Presumed flu, though your flu test was negative.  Drink plenty of fluids, take tylenol or ibuprofen (with food) as needed, and gargle with warm salt water for your throat if needed.   Use robitussin or tessalon for the cough as needed.   This should gradually improve.  Take care.  Let us know if you have other concerns.

## 2015-06-10 DIAGNOSIS — R6889 Other general symptoms and signs: Secondary | ICD-10-CM | POA: Insufficient documentation

## 2015-06-10 NOTE — Assessment & Plan Note (Signed)
Nontoxic, d/w pt about labs.  Presumed flu, though flu test was negative.  Drink plenty of fluids, take tylenol or ibuprofen (with food) as needed, and gargle with warm salt water for your throat if needed.  Use robitussin or tessalon for the cough as needed.  Start tamiflu.  This should gradually improve, she'll update [place] as needed.

## 2015-12-20 ENCOUNTER — Ambulatory Visit (INDEPENDENT_AMBULATORY_CARE_PROVIDER_SITE_OTHER): Payer: BC Managed Care – PPO | Admitting: Primary Care

## 2015-12-20 ENCOUNTER — Encounter: Payer: Self-pay | Admitting: Primary Care

## 2015-12-20 ENCOUNTER — Ambulatory Visit (INDEPENDENT_AMBULATORY_CARE_PROVIDER_SITE_OTHER)
Admission: RE | Admit: 2015-12-20 | Discharge: 2015-12-20 | Disposition: A | Discharge status: Home / Self Care | Payer: BC Managed Care – PPO | Source: Ambulatory Visit | Attending: Primary Care | Admitting: Primary Care

## 2015-12-20 VITALS — BP 124/80 | HR 81 | Temp 98.7°F | Ht 64.25 in | Wt 167.8 lb

## 2015-12-20 DIAGNOSIS — R200 Anesthesia of skin: Secondary | ICD-10-CM

## 2015-12-20 DIAGNOSIS — R208 Other disturbances of skin sensation: Secondary | ICD-10-CM

## 2015-12-20 MED ORDER — PREDNISONE 10 MG PO TABS: ORAL_TABLET | ORAL | 0 refills | Status: DC

## 2015-12-20 NOTE — Progress Notes (Signed)
Pre visit review using our clinic review tool, if applicable. No additional management support is needed unless otherwise documented below in the visit note. 

## 2015-12-20 NOTE — Progress Notes (Signed)
   Subjective:    Patient ID: Emma Knight, female    DOB: 2/15/Emma Poles1957, 60 y.o.   MRN: [id number]  HPI  Emma Knight is a 60 year old female who presents today with a chief complaint of extremity numbness. Her numbness is located to the left upper extremity. She has a history of herniated disc to her cervical spine dating back to 15-20 years ago. She experienced these symptoms years ago which gradually improved. Denies prior surgery or recent evaluation of her cervical spine.  She accidentally fell backward hitting her buttocks in early July this year and has noticed the numbness to her left upper extremity since. Her symptoms are intermittent with various body positions and have become slightly worse. Denies neck, shoulder, and back pain. She's taken some aleve without much improvement in numbness/tingling.   Review of Systems  Musculoskeletal: Negative for arthralgias, back pain, neck pain and neck stiffness.  Skin: Negative for wound.  Neurological: Positive for numbness. Negative for dizziness.       Past Medical History:  Diagnosis Date  . Hyperlipidemia   . Osteopenia   . Sleep apnea      Social History   Social History  . Marital status: Divorced    Spouse name: N/A  . Number of children: N/A  . Years of education: N/A   Occupational History  . Not on file.   Social History Main Topics  . Smoking status: Never Smoker  . Smokeless tobacco: Never Used  . Alcohol use 0.0 oz/week     Comment: rarely  . Drug use: No  . Sexual activity: Not on file   Other Topics Concern  . Not on file   Social History Narrative  . No narrative on file    No past surgical history on file.  No family history on file.  No Known Allergies  Current Outpatient Prescriptions on File Prior to Visit  Medication Sig Dispense Refill  . Calcium Carbonate-Vitamin D (CALCIUM-VITAMIN D) 500-200 MG-UNIT tablet Take 1 tablet by mouth daily.    . Multiple Vitamin (MULTIVITAMIN) tablet Take  1 tablet by mouth daily.     No current facility-administered medications on file prior to visit.     BP 124/80   Pulse 81   Temp 98.7 F (37.1 C) (Oral)   Ht 5' 4.25" (1.632 m)   Wt 167 lb 12.8 oz (76.1 kg)   SpO2 98%   BMI 28.58 kg/m    Objective:   Physical Exam  Constitutional: She appears well-nourished.  Neck: Normal range of motion.  Cardiovascular: Normal rate and regular rhythm.   Pulmonary/Chest: Effort normal and breath sounds normal.  Musculoskeletal: Normal range of motion.  Skin: Skin is warm and dry.          Assessment & Plan:  Radiculopathy:  Numbness to left upper extremity intermittently since early July after fall. History of cervical spine disc disease with similar symptoms in the past. Exam today with normal ROM to neck and shoulders. Negative Tinels and Phalen's sign. Suspect symptoms related to cervical spine. Will obtain xray today for re-evaluation as it's been nearly 20 years. Short term prednisone dose sent to pharmacy for symptoms.  Will also consider physical therapy based off of xray results and response to prednisone.  Emma Knight,Emma Conran Kendal, NP

## 2015-12-20 NOTE — Patient Instructions (Signed)
Start prednisone tablets. Take three tablets for 2 days, then two tablets for 2 days, then one tablet for 2 days.  Complete xray(s) prior to leaving today. I will notify you of your results once received.  It was a pleasure meeting you!

## 2015-12-22 ENCOUNTER — Other Ambulatory Visit: Payer: Self-pay | Admitting: Primary Care

## 2016-02-07 ENCOUNTER — Encounter: Payer: Self-pay | Admitting: Internal Medicine

## 2016-02-07 ENCOUNTER — Ambulatory Visit (INDEPENDENT_AMBULATORY_CARE_PROVIDER_SITE_OTHER): Payer: BC Managed Care – PPO | Admitting: Internal Medicine

## 2016-02-07 VITALS — BP 124/80 | HR 65 | Temp 99.5°F | Wt 166.5 lb

## 2016-02-07 DIAGNOSIS — R109 Unspecified abdominal pain: Secondary | ICD-10-CM

## 2016-02-07 DIAGNOSIS — R102 Pelvic and perineal pain: Secondary | ICD-10-CM

## 2016-02-07 DIAGNOSIS — N3000 Acute cystitis without hematuria: Secondary | ICD-10-CM

## 2016-02-07 LAB — POC URINALSYSI DIPSTICK (AUTOMATED)
Bilirubin, UA: NEGATIVE
Glucose, UA: NEGATIVE
KETONES UA: NEGATIVE
Nitrite, UA: NEGATIVE
PH UA: 6
SPEC GRAV UA: 1.025
Urobilinogen, UA: NEGATIVE

## 2016-02-07 MED ORDER — CIPROFLOXACIN HCL 500 MG PO TABS: 500.0000 mg | ORAL_TABLET | Freq: Two times a day (BID) | ORAL | 0 refills | Status: DC

## 2016-02-07 NOTE — Addendum Note (Signed)
Addended by: Roena MaladyEVONTENNO, Yesica Kemler Y on: 02/07/2016 04:25 PM   Modules accepted: Orders

## 2016-02-07 NOTE — Patient Instructions (Signed)

## 2016-02-07 NOTE — Progress Notes (Signed)
HPI  Pt presents to the clinic today with c/o pelvic pressure and flank pain. This started 1 week ago, but got worse over the last 2 days. She denies urgency, frequency or dysuria. She denies fever, chills, nausea or vaginal complaints. She has tried AZO with some relief.   Review of Systems  Past Medical History:  Diagnosis Date  . Hyperlipidemia   . Osteopenia   . Sleep apnea     No family history on file.  Social History   Social History  . Marital status: Divorced    Spouse name: N/A  . Number of children: N/A  . Years of education: N/A   Occupational History  . Not on file.   Social History Main Topics  . Smoking status: Never Smoker  . Smokeless tobacco: Never Used  . Alcohol use 0.0 oz/week     Comment: rarely  . Drug use: No  . Sexual activity: Not on file   Other Topics Concern  . Not on file   Social History Narrative  . No narrative on file    No Known Allergies  Constitutional: Denies fever, malaise, fatigue, headache or abrupt weight changes.   GU: Pt reports flank pain and pelvic pressure. Denies urgency, frequency, pain with urination, burning sensation, blood in urine, odor or discharge. Skin: Denies redness, rashes, lesions or ulcercations.   No other specific complaints in a complete review of systems (except as listed in HPI above).    Objective:   Physical Exam  BP 124/80   Pulse 65   Temp 99.5 F (37.5 C) (Oral)   Wt 166 lb 8 oz (75.5 kg)   SpO2 98%   BMI 28.36 kg/m  Wt Readings from Last 3 Encounters:  02/07/16 166 lb 8 oz (75.5 kg)  12/20/15 167 lb 12.8 oz (76.1 kg)  06/09/15 159 lb 4 oz (72.2 kg)    General: Appears her stated age, well developed, well nourished in NAD. Cardiovascular: Normal rate and rhythm. S1,S2 noted.   Pulmonary/Chest: Normal effort and positive vesicular breath sounds. No respiratory distress. No wheezes, rales or ronchi noted.  Abdomen: Soft. Normal bowel sounds. No distention or masses noted.   Tender to palpation over the bladder area. No CVA tenderness.      Assessment & Plan:   Pelvic pressure and flank pain:  Urinalysis: 3+ leuks and 1+ blood Will send urine culture eRx sent if for Cipro 500 mg BID x 5 days OK to take AZO OTC Drink plenty of fluids  RTC as needed or if symptoms persist. Nicki ReaperBAITY, Vahan Wadsworth, NP

## 2016-02-10 ENCOUNTER — Other Ambulatory Visit: Payer: Self-pay | Admitting: Internal Medicine

## 2016-02-10 ENCOUNTER — Telehealth: Payer: Self-pay

## 2016-02-10 LAB — URINE CULTURE

## 2016-02-10 MED ORDER — HYDROXYZINE HCL 10 MG PO TABS: 10.0000 mg | ORAL_TABLET | Freq: Two times a day (BID) | ORAL | 0 refills | Status: DC | PRN

## 2016-02-10 NOTE — Addendum Note (Signed)
Addended by: Shon MilletWATLINGTON, Junnie Loschiavo M on: 02/10/2016 03:21 PM   Modules accepted: Orders

## 2016-02-10 NOTE — Telephone Encounter (Signed)
Pt notified Rx sent to pharmacy and I advise pt of Rene KocherRegina Baity's comments. Pt advise me that Rx was sent to wrong pharmacy so I sent it to the correct pharmacy and cancelled it at the wrong pharmacy

## 2016-02-10 NOTE — Telephone Encounter (Signed)
Will send in a few pills of Hydroxyzine. If ineffective, she will need to call back tomorrow to discuss with PCP

## 2016-02-10 NOTE — Telephone Encounter (Signed)
Pt is own her way to get ready for her daughters wedding and is feeling very anxious; pt request few pills of med for [facility] [place]. Pt did not realize how anxious she was until being on the way to get ready for the wedding. Pt will ck with pharmacy later today. Pt last seen 02/07/16 for pelvic pressure and Dr Milinda Antisower is out of office today.Please advise.

## 2016-02-14 ENCOUNTER — Encounter: Payer: Self-pay | Admitting: Internal Medicine

## 2016-02-17 ENCOUNTER — Encounter: Payer: Self-pay | Admitting: Internal Medicine

## 2016-02-25 ENCOUNTER — Ambulatory Visit (INDEPENDENT_AMBULATORY_CARE_PROVIDER_SITE_OTHER): Payer: BC Managed Care – PPO | Admitting: Family Medicine

## 2016-02-25 ENCOUNTER — Encounter: Payer: Self-pay | Admitting: Family Medicine

## 2016-02-25 VITALS — BP 122/66 | HR 68 | Temp 98.0°F | Wt 170.2 lb

## 2016-02-25 DIAGNOSIS — Z23 Encounter for immunization: Secondary | ICD-10-CM

## 2016-02-25 DIAGNOSIS — F330 Major depressive disorder, recurrent, mild: Secondary | ICD-10-CM | POA: Diagnosis not present

## 2016-02-25 DIAGNOSIS — F339 Major depressive disorder, recurrent, unspecified: Secondary | ICD-10-CM | POA: Insufficient documentation

## 2016-02-25 MED ORDER — SERTRALINE HCL 50 MG PO TABS: 50.0000 mg | ORAL_TABLET | Freq: Every day | ORAL | 3 refills | Status: DC

## 2016-02-25 NOTE — Patient Instructions (Signed)
Try to regular exercise.  Call if interested in counseling. Start sertraline at bedtime.

## 2016-02-25 NOTE — Progress Notes (Signed)
   Subjective:    Patient ID: Emma Knight, female    DOB: 01-Oct-1955, 60 y.o.   MRN: [id number]  HPI   60 year old female presents with depressed mood in last 1-2 months. More emotional.  Increase stress lately. Brother with cancer.  She has been down, depressed. Isolating herself, feeling lonely. Forcing her self to do things. Wakes up a lot at night, able to go back to sleep  PHQ9;7 GAD7: 4  Hx of depression: treated with lexapro.Marland Kitchen. Resolved on its own.  Review of Systems  Constitutional: Positive for fatigue. Negative for fever.  HENT: Negative for ear pain.   Eyes: Negative for pain.  Respiratory: Negative for chest tightness and shortness of breath.   Cardiovascular: Negative for chest pain, palpitations and leg swelling.  Gastrointestinal: Negative for abdominal pain.  Genitourinary: Negative for dysuria.       Objective:   Physical Exam  Constitutional: Vital signs are normal. She appears well-developed and well-nourished. She is cooperative.  Non-toxic appearance. She does not appear ill. No distress.  HENT:  Head: Normocephalic.  Right Ear: Hearing, tympanic membrane, external ear and ear canal normal. Tympanic membrane is not erythematous, not retracted and not bulging.  Left Ear: Hearing, tympanic membrane, external ear and ear canal normal. Tympanic membrane is not erythematous, not retracted and not bulging.  Nose: No mucosal edema or rhinorrhea. Right sinus exhibits no maxillary sinus tenderness and no frontal sinus tenderness. Left sinus exhibits no maxillary sinus tenderness and no frontal sinus tenderness.  Mouth/Throat: Uvula is midline, oropharynx is clear and moist and mucous membranes are normal.  Eyes: Conjunctivae, EOM and lids are normal. Pupils are equal, round, and reactive to light. Lids are everted and swept, no foreign bodies found.  Neck: Trachea normal and normal range of motion. Neck supple. Carotid bruit is not present. No thyroid mass and no  thyromegaly present.  Cardiovascular: Normal rate, regular rhythm, S1 normal, S2 normal, normal heart sounds, intact distal pulses and normal pulses.  Exam reveals no gallop and no friction rub.   No murmur heard. Pulmonary/Chest: Effort normal and breath sounds normal. No tachypnea. No respiratory distress. She has no decreased breath sounds. She has no wheezes. She has no rhonchi. She has no rales.  Abdominal: Soft. Normal appearance and bowel sounds are normal. There is no tenderness.  Neurological: She is alert.  Skin: Skin is warm, dry and intact. No rash noted.  Psychiatric: Her speech is normal. Judgment and thought content normal. Her mood appears not anxious. She is slowed and withdrawn. Cognition and memory are normal. She exhibits a depressed mood.  tearful          Assessment & Plan:

## 2016-02-25 NOTE — Assessment & Plan Note (Signed)
Start sertraline 50 mg daily.  Offered counseling.  Close follow up in 4 weeks for re-assessment.

## 2016-02-27 ENCOUNTER — Encounter: Payer: Self-pay | Admitting: Family Medicine

## 2016-03-23 ENCOUNTER — Encounter: Payer: Self-pay | Admitting: Family Medicine

## 2016-03-23 ENCOUNTER — Ambulatory Visit (INDEPENDENT_AMBULATORY_CARE_PROVIDER_SITE_OTHER): Payer: BC Managed Care – PPO | Admitting: Family Medicine

## 2016-03-23 VITALS — BP 130/78 | HR 58 | Temp 98.7°F | Ht 64.25 in | Wt 165.0 lb

## 2016-03-23 DIAGNOSIS — F330 Major depressive disorder, recurrent, mild: Secondary | ICD-10-CM

## 2016-03-23 MED ORDER — SERTRALINE HCL 50 MG PO TABS: 50.0000 mg | ORAL_TABLET | Freq: Every day | ORAL | 1 refills | Status: DC

## 2016-03-23 NOTE — Progress Notes (Signed)
Pre visit review using our clinic review tool, if applicable. No additional management support is needed unless otherwise documented below in the visit note. 

## 2016-03-23 NOTE — Assessment & Plan Note (Signed)
Improved control on sertraline. Continue current dose.. plan 3-6 months course but possibly longer. Follow up with PCP as needed.

## 2016-03-23 NOTE — Patient Instructions (Signed)
Continue on current dose of sertraline.  Call if you feel need inrcease of medication.  Plan staying on med at least 3-6 months.

## 2016-03-23 NOTE — Addendum Note (Signed)
Addended by: Desmond DikeKNIGHT, Lea Baine H on: 03/23/2016 10:59 AM   Modules accepted: Orders

## 2016-03-23 NOTE — Progress Notes (Signed)
   Subjective:    Patient ID: Emma Knight, female    DOB: April 09, 1956, 60 y.o.   MRN: [id number]  HPI    60 year old female pt  Of Dr. Royden Purlower's presents for follow up depression.   Major, depression, moderate, recurrent: She was started on sertraline 50 mg daily 1 month ago.  She reports she has noted improvement. Feeling better about getting out, more motivated, less depression. She feels 90% improved overall.  No issues with sleep.  No SI, no HI. Improvement in energy. No anxiety, no panic.  No SE to sertraline at this point. Initial nausea improved.   Review of Systems  Constitutional: Negative for fatigue and fever.  HENT: Negative for ear pain.   Eyes: Negative for pain.  Respiratory: Negative for chest tightness and shortness of breath.   Cardiovascular: Negative for chest pain, palpitations and leg swelling.  Gastrointestinal: Negative for abdominal pain.  Genitourinary: Negative for dysuria.       Objective:   Physical Exam  Constitutional: Vital signs are normal. She appears well-developed and well-nourished. She is cooperative.  Non-toxic appearance. She does not appear ill. No distress.  HENT:  Head: Normocephalic.  Right Ear: Hearing, tympanic membrane, external ear and ear canal normal. Tympanic membrane is not erythematous, not retracted and not bulging.  Left Ear: Hearing, tympanic membrane, external ear and ear canal normal. Tympanic membrane is not erythematous, not retracted and not bulging.  Nose: No mucosal edema or rhinorrhea. Right sinus exhibits no maxillary sinus tenderness and no frontal sinus tenderness. Left sinus exhibits no maxillary sinus tenderness and no frontal sinus tenderness.  Mouth/Throat: Uvula is midline, oropharynx is clear and moist and mucous membranes are normal.  Eyes: Conjunctivae, EOM and lids are normal. Pupils are equal, round, and reactive to light. Lids are everted and swept, no foreign bodies found.  Neck: Trachea normal and  normal range of motion. Neck supple. Carotid bruit is not present. No thyroid mass and no thyromegaly present.  Cardiovascular: Normal rate, regular rhythm, S1 normal, S2 normal, normal heart sounds, intact distal pulses and normal pulses.  Exam reveals no gallop and no friction rub.   No murmur heard. Pulmonary/Chest: Effort normal and breath sounds normal. No tachypnea. No respiratory distress. She has no decreased breath sounds. She has no wheezes. She has no rhonchi. She has no rales.  Abdominal: Soft. Normal appearance and bowel sounds are normal. There is no tenderness.  Neurological: She is alert.  Skin: Skin is warm, dry and intact. No rash noted.  Psychiatric: Her speech is normal and behavior is normal. Judgment and thought content normal. Her mood appears not anxious. Cognition and memory are normal. She does not exhibit a depressed mood.          Assessment & Plan:

## 2016-03-25 ENCOUNTER — Encounter: Payer: Self-pay | Admitting: Family Medicine

## 2016-04-02 MED ORDER — SERTRALINE HCL 100 MG PO TABS: 100.0000 mg | ORAL_TABLET | Freq: Every day | ORAL | 3 refills | Status: DC

## 2016-07-10 ENCOUNTER — Other Ambulatory Visit: Payer: Self-pay | Admitting: Family Medicine

## 2016-09-23 ENCOUNTER — Other Ambulatory Visit: Payer: Self-pay | Admitting: Family Medicine

## 2016-09-23 NOTE — Telephone Encounter (Signed)
Last office visit 03/23/16 with Dr. Ermalene SearingBedsole.  AVS states plan is to stay on meds for at least 3-6 months.  Refill?

## 2016-09-24 NOTE — Telephone Encounter (Signed)
Will refill electronically  

## 2016-10-23 ENCOUNTER — Telehealth: Payer: Self-pay

## 2016-10-23 NOTE — Telephone Encounter (Signed)
Looks like through a [facility] it to 100 mg - please verify with pt that is what she is taking  Thanks  Ok to refill for 6 mo

## 2016-10-23 NOTE — Telephone Encounter (Signed)
Pt request refill [facility]ertraline [facility]. Advised pt to ck with pharmacy; refill done sertraline 50 mg # 90 x 3 on 09/24/16. Pt will ck with pharmacy.

## 2016-10-23 NOTE — Telephone Encounter (Signed)
[facility] left v/m requesting verification of sertraline; pt advised [facility] that the sertraline 50 mg was increased from one tab at hs to 2 tabs at hs. Please advise. On med list Dr Ermalene SearingBedsole had sertraline 100 mg dated 07/10/16 and Dr Milinda Antisower has on med list sertraline 50 mg one hs dated 09/24/16.

## 2016-10-24 MED ORDER — SERTRALINE HCL 100 MG PO TABS: 100.0000 mg | ORAL_TABLET | Freq: Every day | ORAL | 1 refills | Status: DC

## 2016-10-24 NOTE — Telephone Encounter (Signed)
Spoke with pt and she is still on the 100mg , called [facility] and cancel the 50 mg Rx on file and refilled the 100mg 

## 2017-01-22 ENCOUNTER — Other Ambulatory Visit: Payer: Self-pay | Admitting: Family Medicine

## 2017-04-26 ENCOUNTER — Other Ambulatory Visit: Payer: Self-pay | Admitting: Family Medicine

## 2017-04-26 ENCOUNTER — Other Ambulatory Visit: Payer: Self-pay

## 2017-04-26 ENCOUNTER — Telehealth: Payer: Self-pay | Admitting: Family Medicine

## 2017-04-26 MED ORDER — SERTRALINE HCL 100 MG PO TABS: 100.0000 mg | ORAL_TABLET | Freq: Every day | ORAL | 1 refills | Status: DC

## 2017-04-26 NOTE — Telephone Encounter (Signed)
Copied from CRM [phone]. Topic: Inquiry >> Apr 26, 2017  9:11 AM Yvonna Alanisobinson, Andra M wrote: Reason for CRM: Patient called requesting a refill of sertraline (ZOLOFT) 100 MG tablet. Patient only has one left and needs medication ASAP. Patient's preferred pharmacy is [facility] [place] - [address] [phone] (Phone)  [phone] (Fax).

## 2017-04-26 NOTE — Telephone Encounter (Signed)
Waiting on response from Dr Milinda Antisower.

## 2017-04-26 NOTE — Telephone Encounter (Signed)
Pt last seen 03/23/16 with note in visit that pt stay on med 3 - 6 months.Please advise. No future appt scheduled.

## 2017-04-26 NOTE — Telephone Encounter (Signed)
Does patient need annual exam or follow up before RF?

## 2017-04-26 NOTE — Telephone Encounter (Signed)
Copied from CRM #[id number]. Topic: Inquiry >> Apr 26, 2017  9:11 AM Robinson, Andra M wrote: Reason for CRM: Patient called requesting a refill of sertraline (ZOLOFT) 100 MG tablet. Patient only has one left and needs medication ASAP. Patient's preferred pharmacy is [facility] - [place], [place] - [address] [phone] (Phone)  [phone] (Fax).    

## 2017-04-27 NOTE — Telephone Encounter (Signed)
Left VM requesting pt to call the office back, if pt calls back please schedule 30 min f/u appt with Dr. Milinda Antisower before we can refill med

## 2017-04-27 NOTE — Telephone Encounter (Signed)
Please schedule f/u and refill until then  

## 2017-04-27 NOTE — Telephone Encounter (Signed)
Per phone note pt does need to schedule a f/u appt before we can refill med, called pt and left VM requesting pt to call the office back

## 2017-04-30 NOTE — Telephone Encounter (Signed)
Pt called back and schedule an appt with Dr. Milinda Antisower

## 2017-05-14 ENCOUNTER — Ambulatory Visit: Payer: BC Managed Care – PPO | Admitting: Family Medicine

## 2017-05-14 ENCOUNTER — Encounter: Payer: Self-pay | Admitting: Family Medicine

## 2017-05-14 VITALS — BP 124/72 | HR 73 | Temp 98.8°F | Ht 64.25 in | Wt 170.0 lb

## 2017-05-14 DIAGNOSIS — R05 Cough: Secondary | ICD-10-CM | POA: Diagnosis not present

## 2017-05-14 DIAGNOSIS — F330 Major depressive disorder, recurrent, mild: Secondary | ICD-10-CM | POA: Diagnosis not present

## 2017-05-14 DIAGNOSIS — E7800 Pure hypercholesterolemia, unspecified: Secondary | ICD-10-CM | POA: Diagnosis not present

## 2017-05-14 DIAGNOSIS — R059 Cough, unspecified: Secondary | ICD-10-CM

## 2017-05-14 DIAGNOSIS — L732 Hidradenitis suppurativa: Secondary | ICD-10-CM | POA: Diagnosis not present

## 2017-05-14 MED ORDER — MUPIROCIN 2 % EX OINT: 1.0000 "application " | TOPICAL_OINTMENT | Freq: Two times a day (BID) | CUTANEOUS | 1 refills | Status: DC

## 2017-05-14 MED ORDER — SERTRALINE HCL 100 MG PO TABS: 100.0000 mg | ORAL_TABLET | Freq: Every day | ORAL | 3 refills | Status: DC

## 2017-05-14 MED ORDER — BENZONATATE 200 MG PO CAPS: 200.0000 mg | ORAL_CAPSULE | Freq: Three times a day (TID) | ORAL | 1 refills | Status: DC | PRN

## 2017-05-14 NOTE — Patient Instructions (Addendum)
I think you have post viral cough syndrome  We need to stop the cycle Take tessalon three times daily  otc any cough med with DM is helpful  Drink fluids   For the boil - (looks like it is getting better)- keep clean (soap and water or hibclens)   Also bactroban ointment twice daily as needed Warm compresses If no improvement let me know  I think you have a condition called hydradenitis - common/ like acne   Continue zoloft

## 2017-05-14 NOTE — Progress Notes (Signed)
Subjective:    Patient ID: Emma Knight, female    DOB: [birth year], 62 y.o.   MRN: [id number]  HPI Here for f/u of chronic medical problems as well as cough for 1 mo and a boil  Cough-started dec 5th with laryngitis  Has had a cough ever since (raspy)  No wheezing  Throat hurt for a few days-that is better  Dry-not prod  No nasal symptoms  She took otc alka selzer cold/ nyquil- did not help  No heartburn or gerd symptoms  Boil - down near her "bottom"  Same place as one before - common for her - occ they last several months  Usually go away by themselves  On the R side towards buttocks  - it is draining (some blood)- has not seen pus  Has used hibiclens on it (that she has at home)   Wt Readings from Last 3 Encounters:  05/14/17 170 lb (77.1 kg)  03/23/16 165 lb (74.8 kg)  02/25/16 170 lb 4 oz (77.2 kg)  mt her weight -doing well ! Lost over 100 lb since 2013  28.95 kg/m    H/o hyperlipidemia Lab Results  Component Value Date   CHOL 244 (H) 03/18/2014   HDL 82.00 03/18/2014   LDLCALC 154 (H) 03/18/2014   LDLDIRECT 130.7 08/05/2010   TRIG 38.0 03/18/2014   CHOLHDL 3 03/18/2014  not ready to check cholesterol yet  Diet is quite good except for some holiday days  Avoiding carbs/sweets    H/o depression  takex zoloft 100 mg  Doing well and wants to stay on that dose  Feels better than she did  Taking care herself  Goes dancing 2 times per weeks  She will start working out at the hospital gym as well  Things are going ok    Patient Active Problem List   Diagnosis Date Noted  . Cough 05/14/2017  . Hydradenitis 05/14/2017  . Depression, major, recurrent (HCC) 02/25/2016  . Family history of leukemia 03/18/2014  . Encounter for routine gynecological examination 09/20/2012  . Routine general medical examination at a health care facility 09/16/2012  . Other screening mammogram 02/02/2011  . Overweight (BMI 25.0-29.9) 12/30/2009  . PURE HYPERCHOLESTEROLEMIA  03/03/2009  . OSTEOPENIA 05/20/2008   Past Medical History:  Diagnosis Date  . Hyperlipidemia   . Osteopenia   . Sleep apnea    History reviewed. No pertinent surgical history. Social History   Tobacco Use  . Smoking status: Never Smoker  . Smokeless tobacco: Never Used  Substance Use Topics  . Alcohol use: Yes    Alcohol/week: 0.0 oz    Comment: rarely  . Drug use: No   History reviewed. No pertinent family history. No Known Allergies No current outpatient medications on file prior to visit.   No current facility-administered medications on file prior to visit.      Review of Systems  Constitutional: Negative for activity change, appetite change, fatigue, fever and unexpected weight change.  HENT: Negative for congestion, ear pain, rhinorrhea, sinus pressure and sore throat.   Eyes: Negative for pain, redness and visual disturbance.  Respiratory: Negative for cough, shortness of breath and wheezing.   Cardiovascular: Negative for chest pain and palpitations.  Gastrointestinal: Negative for abdominal pain, blood in stool, constipation and diarrhea.  Endocrine: Negative for polydipsia and polyuria.  Genitourinary: Negative for dysuria, frequency and urgency.  Musculoskeletal: Negative for arthralgias, back pain and myalgias.  Skin: Negative for pallor and rash.  Pos for cysts in groin area   Allergic/Immunologic: Negative for environmental allergies.  Neurological: Negative for dizziness, syncope and headaches.  Hematological: Negative for adenopathy. Does not bruise/bleed easily.  Psychiatric/Behavioral: Negative for decreased concentration, dysphoric mood and sleep disturbance. The patient is not nervous/anxious.        Objective:   Physical Exam  Constitutional: She appears well-developed and well-nourished. No distress.  overwt and well appearing   HENT:  Head: Normocephalic and atraumatic.  Right Ear: External ear normal.  Left Ear: External ear normal.    Nose: Nose normal.  Mouth/Throat: Oropharynx is clear and moist.  No rhinorrhea or pnd   Some dry skin in ear canals  Eyes: Conjunctivae and EOM are normal. Pupils are equal, round, and reactive to light.  Neck: Normal range of motion. Neck supple. No JVD present. Carotid bruit is not present. No thyromegaly present.  Cardiovascular: Normal rate, regular rhythm, normal heart sounds and intact distal pulses. Exam reveals no gallop.  Pulmonary/Chest: Effort normal and breath sounds normal. No respiratory distress. She has no wheezes. She has no rales.  No crackles  Abdominal: She exhibits no abdominal bruit.  Musculoskeletal: She exhibits no edema or tenderness.  Lymphadenopathy:    She has no cervical adenopathy.  Neurological: She is alert. She has normal reflexes.  Skin: Skin is warm and dry. No rash noted. No pallor.  Findings of hydradenitis in groin area with healed cysts  One open cyst R side below labia is firm/pink and nt (no drainage) --looks to be healing   No other areas of this   bilat thumbnails with ridges in them   Baseline scars on inner thighs from skin removal surgery  Psychiatric: She has a normal mood and affect. Her speech is normal and behavior is normal. Thought content normal. Her mood appears not anxious. Her affect is not blunt and not labile. She does not exhibit a depressed mood.  Good mood  Talkative           Assessment & Plan:   Problem List Items Addressed This Visit      Musculoskeletal and Integument   Hydradenitis    In groin area  The cyst that drained this am does not look infected bactroban px to use bid Warm compresses  Soap and water or Hibiclens cleaning regularly  Alert if any areas with signs of infection (red/pain/drainage/odor) Keep cool and dry when possible          Other   Cough    Post viral cough syndrome from illness in dec  Tessalon tid guif-DM otc as directed  Fluids/ rest /voice rest if needed No s/s of  gerd or all rhin right now Update if not starting to improve in a week or if worsening        Depression, major, recurrent (HCC)    Doing quite well  Reviewed stressors/ coping techniques/symptoms/ support sources/ tx options and side effects in detail today  Wants to stay on zoloft 100 mg  Refill done  Disc poss side eff/no problems  Disc self care/exercise/outdoor time and counseling when needed       Relevant Medications   sertraline (ZOLOFT) 100 MG tablet   PURE HYPERCHOLESTEROLEMIA - Primary    Pt declines cholesterol check today  Enc to return for this and a health mt exam  Disc low cholesterol diet

## 2017-05-14 NOTE — Assessment & Plan Note (Signed)
Doing quite well  Reviewed stressors/ coping techniques/symptoms/ support sources/ tx options and side effects in detail today  Wants to stay on zoloft 100 mg  Refill done  Disc poss side eff/no problems  Disc self care/exercise/outdoor time and counseling when needed

## 2017-05-14 NOTE — Assessment & Plan Note (Signed)
Pt declines cholesterol check today  Enc to return for this and a health mt exam  Disc low cholesterol diet

## 2017-05-14 NOTE — Assessment & Plan Note (Signed)
Post viral cough syndrome from illness in dec  Tessalon tid guif-DM otc as directed  Fluids/ rest /voice rest if needed No s/s of gerd or all rhin right now Update if not starting to improve in a week or if worsening

## 2017-05-14 NOTE — Assessment & Plan Note (Signed)
In groin area  The cyst that drained this am does not look infected bactroban px to use bid Warm compresses  Soap and water or Hibiclens cleaning regularly  Alert if any areas with signs of infection (red/pain/drainage/odor) Keep cool and dry when possible

## 2017-07-02 ENCOUNTER — Other Ambulatory Visit: Payer: Self-pay | Admitting: Family Medicine

## 2017-07-02 DIAGNOSIS — Z1231 Encounter for screening mammogram for malignant neoplasm of breast: Secondary | ICD-10-CM

## 2017-07-26 ENCOUNTER — Ambulatory Visit
Admission: RE | Admit: 2017-07-26 | Discharge: 2017-07-26 | Disposition: A | Discharge status: Home / Self Care | Payer: BC Managed Care – PPO | Source: Ambulatory Visit | Attending: Family Medicine | Admitting: Family Medicine

## 2017-07-26 DIAGNOSIS — Z1231 Encounter for screening mammogram for malignant neoplasm of breast: Secondary | ICD-10-CM | POA: Insufficient documentation

## 2018-02-19 ENCOUNTER — Encounter: Payer: Self-pay | Admitting: Family Medicine

## 2018-03-22 ENCOUNTER — Ambulatory Visit (INDEPENDENT_AMBULATORY_CARE_PROVIDER_SITE_OTHER): Payer: BC Managed Care – PPO | Admitting: Family Medicine

## 2018-03-22 ENCOUNTER — Other Ambulatory Visit (HOSPITAL_COMMUNITY)
Admission: RE | Admit: 2018-03-22 | Discharge: 2018-03-22 | Disposition: A | Discharge status: Home / Self Care | Payer: BC Managed Care – PPO | Source: Ambulatory Visit | Attending: Family Medicine | Admitting: Family Medicine

## 2018-03-22 ENCOUNTER — Encounter: Payer: Self-pay | Admitting: Family Medicine

## 2018-03-22 VITALS — BP 130/78 | HR 58 | Temp 98.8°F | Ht 64.25 in | Wt 174.0 lb

## 2018-03-22 DIAGNOSIS — M8580 Other specified disorders of bone density and structure, unspecified site: Secondary | ICD-10-CM

## 2018-03-22 DIAGNOSIS — Z1159 Encounter for screening for other viral diseases: Secondary | ICD-10-CM | POA: Insufficient documentation

## 2018-03-22 DIAGNOSIS — Z01419 Encounter for gynecological examination (general) (routine) without abnormal findings: Secondary | ICD-10-CM | POA: Diagnosis present

## 2018-03-22 DIAGNOSIS — E7800 Pure hypercholesterolemia, unspecified: Secondary | ICD-10-CM

## 2018-03-22 DIAGNOSIS — Z114 Encounter for screening for human immunodeficiency virus [HIV]: Secondary | ICD-10-CM | POA: Insufficient documentation

## 2018-03-22 DIAGNOSIS — E2839 Other primary ovarian failure: Secondary | ICD-10-CM | POA: Insufficient documentation

## 2018-03-22 DIAGNOSIS — E663 Overweight: Secondary | ICD-10-CM

## 2018-03-22 DIAGNOSIS — F330 Major depressive disorder, recurrent, mild: Secondary | ICD-10-CM

## 2018-03-22 DIAGNOSIS — Z0000 Encounter for general adult medical examination without abnormal findings: Secondary | ICD-10-CM | POA: Diagnosis not present

## 2018-03-22 LAB — COMPREHENSIVE METABOLIC PANEL
ALT: 14 U/L (ref 0–35)
AST: 17 U/L (ref 0–37)
Albumin: 4.3 g/dL (ref 3.5–5.2)
Alkaline Phosphatase: 55 U/L (ref 39–117)
BUN: 12 mg/dL (ref 6–23)
CALCIUM: 9.1 mg/dL (ref 8.4–10.5)
CHLORIDE: 105 meq/L (ref 96–112)
CO2: 31 meq/L (ref 19–32)
CREATININE: 0.65 mg/dL (ref 0.40–1.20)
GFR: 97.61 mL/min (ref >60.00)
GLUCOSE: 100 mg/dL — AB (ref 70–99)
Potassium: 4.3 mEq/L (ref 3.5–5.1)
Sodium: 140 mEq/L (ref 135–145)
Total Bilirubin: 0.4 mg/dL (ref 0.2–1.2)
Total Protein: 6.6 g/dL (ref 6.0–8.3)

## 2018-03-22 LAB — CBC WITH DIFFERENTIAL/PLATELET
BASOS PCT: 0.6 % (ref 0.0–3.0)
Basophils Absolute: 0 10*3/uL (ref 0.0–0.1)
EOS ABS: 0 10*3/uL (ref 0.0–0.7)
Eosinophils Relative: 0.5 % (ref 0.0–5.0)
HEMATOCRIT: 40.6 % (ref 36.0–46.0)
Hemoglobin: 13.6 g/dL (ref 12.0–15.0)
Lymphocytes Relative: 48.2 % — ABNORMAL HIGH (ref 12.0–46.0)
Lymphs Abs: 1.8 10*3/uL (ref 0.7–4.0)
MCHC: 33.4 g/dL (ref 30.0–36.0)
MCV: 94.1 fl (ref 78.0–100.0)
Monocytes Absolute: 0.4 10*3/uL (ref 0.1–1.0)
Monocytes Relative: 10 % (ref 3.0–12.0)
NEUTROS ABS: 1.5 10*3/uL (ref 1.4–7.7)
Neutrophils Relative %: 40.7 % — ABNORMAL LOW (ref 43.0–77.0)
PLATELETS: 195 10*3/uL (ref 150.0–400.0)
RBC: 4.32 Mil/uL (ref 3.87–5.11)
RDW: 13.3 % (ref 11.5–15.5)
WBC: 3.8 10*3/uL — ABNORMAL LOW (ref 4.0–10.5)

## 2018-03-22 LAB — LIPID PANEL
CHOL/HDL RATIO: 2
Cholesterol: 225 mg/dL — ABNORMAL HIGH (ref 0–200)
HDL: 93.1 mg/dL (ref >39.00)
LDL CALC: 121 mg/dL — AB (ref 0–99)
NONHDL: 131.8
TRIGLYCERIDES: 54 mg/dL (ref 0.0–149.0)
VLDL: 10.8 mg/dL (ref 0.0–40.0)

## 2018-03-22 LAB — TSH: TSH: 2.74 u[IU]/mL (ref 0.35–4.50)

## 2018-03-22 MED ORDER — SERTRALINE HCL 100 MG PO TABS: 100.0000 mg | ORAL_TABLET | Freq: Every day | ORAL | 3 refills | Status: DC

## 2018-03-22 NOTE — Assessment & Plan Note (Signed)
Hep C screen with labs

## 2018-03-22 NOTE — Assessment & Plan Note (Signed)
Reviewed health habits including diet and exercise and skin cancer prevention Reviewed appropriate screening tests for age  Also reviewed health mt list, fam hx and immunization status , as well as social and family history   See HPI Labs ordered Hep C / HIV screening  Gyn exam and pap done  dexa ordered  Disc imp of ca and D for bone health  F/u with dermatology for skin screen as planned

## 2018-03-22 NOTE — Assessment & Plan Note (Signed)
Disc goals for lipids and reasons to control them Rev last labs with pt Rev low sat fat diet in detail Lab today Diet is fair  

## 2018-03-22 NOTE — Progress Notes (Signed)
Subjective:    Patient ID: Emma Knight, female    DOB: 1956-02-21, 62 y.o.   MRN: [id number]  HPI  Here for health maintenance exam and to review chronic medical problems    Not doing a whole lot  Brother died in January 09, 2023 (bladder ca)   Wt Readings from Last 3 Encounters:  03/22/18 174 lb (78.9 kg)  05/14/17 170 lb (77.1 kg)  03/23/16 165 lb (74.8 kg)  taking fair care of herself  29.64 kg/m   Was in a funk for a while (ate more)  Improved now  Still taking zoloft   Dancing for exercise - 2 times per week - shags  Also home /yard work   Hep C screen HIV screen  dexa 5/14 (h/o osteopenia)  Will schedule dexa No falls /fx  Not good about supplements   Pap 5/14-normal  No symptoms    Flu shot 10/19 Tetanus shot 11/15   Colonoscopy 8/09-has appt for consultation at [facility] in January   Has derm appt 12/26 for skin check   Mammogram 3/19  No lumps on self exam    Hyperlipidemia Lab Results  Component Value Date   CHOL 244 (H) 03/18/2014   HDL 82.00 03/18/2014   LDLCALC 154 (H) 03/18/2014   LDLDIRECT 130.7 08/05/2010   TRIG 38.0 03/18/2014   CHOLHDL 3 03/18/2014   Patient Active Problem List   Diagnosis Date Noted  . Encounter for hepatitis C screening test for low risk patient 03/22/2018  . Encounter for screening for HIV 03/22/2018  . Estrogen deficiency 03/22/2018  . Cough 05/14/2017  . Hydradenitis 05/14/2017  . Depression, major, recurrent (HCC) 02/25/2016  . Family history of leukemia 03/18/2014  . Encounter for routine gynecological examination 09/20/2012  . Routine general medical examination at a health care facility 09/16/2012  . Other screening mammogram 02/02/2011  . Overweight (BMI 25.0-29.9) 12/30/2009  . PURE HYPERCHOLESTEROLEMIA 03/03/2009  . Osteopenia 05/20/2008   Past Medical History:  Diagnosis Date  . Hyperlipidemia   . Osteopenia   . Sleep apnea    History reviewed. No pertinent surgical history. Social  History   Tobacco Use  . Smoking status: Never Smoker  . Smokeless tobacco: Never Used  Substance Use Topics  . Alcohol use: Yes    Alcohol/week: 0.0 standard drinks    Comment: rarely  . Drug use: No   Family History  Problem Relation Age of Onset  . Bladder Cancer Brother    No Known Allergies No current outpatient medications on file prior to visit.   No current facility-administered medications on file prior to visit.      Review of Systems  Constitutional: Negative for activity change, appetite change, fatigue, fever and unexpected weight change.  HENT: Negative for congestion, ear pain, rhinorrhea, sinus pressure and sore throat.   Eyes: Negative for pain, redness and visual disturbance.  Respiratory: Negative for cough, shortness of breath and wheezing.   Cardiovascular: Negative for chest pain and palpitations.  Gastrointestinal: Negative for abdominal pain, blood in stool, constipation and diarrhea.  Endocrine: Negative for polydipsia and polyuria.  Genitourinary: Negative for dysuria, frequency and urgency.  Musculoskeletal: Negative for arthralgias, back pain and myalgias.  Skin: Negative for pallor and rash.  Allergic/Immunologic: Negative for environmental allergies.  Neurological: Negative for dizziness, syncope and headaches.  Hematological: Negative for adenopathy. Does not bruise/bleed easily.  Psychiatric/Behavioral: Negative for decreased concentration and dysphoric mood. The patient is not nervous/anxious.  Grief        Objective:   Physical Exam  Constitutional: She appears well-developed and well-nourished. No distress.  obese and well appearing   HENT:  Head: Normocephalic and atraumatic.  Right Ear: External ear normal.  Left Ear: External ear normal.  Mouth/Throat: Oropharynx is clear and moist.  Eyes: Pupils are equal, round, and reactive to light. Conjunctivae and EOM are normal. Right eye exhibits no discharge. Left eye exhibits no  discharge. No scleral icterus.  Neck: Normal range of motion. Neck supple. No JVD present. Carotid bruit is not present. No thyromegaly present.  Cardiovascular: Normal rate, regular rhythm, normal heart sounds and intact distal pulses. Exam reveals no gallop.  Pulmonary/Chest: Effort normal and breath sounds normal. No respiratory distress. She has no wheezes. She has no rales. She exhibits no tenderness. No breast tenderness, discharge or bleeding.  Abdominal: Soft. Bowel sounds are normal. She exhibits no distension, no abdominal bruit and no mass. There is no tenderness.  Genitourinary: No breast tenderness, discharge or bleeding.  Genitourinary Comments: Breast exam: No mass, nodules, thickening, tenderness, bulging, retraction, inflamation, nipple discharge or skin changes noted.  No axillary or clavicular LA.      Musculoskeletal: Normal range of motion. She exhibits no edema, tenderness or deformity.  No kyphosis  Hammertoes and bunion def   Lymphadenopathy:    She has no cervical adenopathy.  Neurological: She is alert. She has normal reflexes. She displays normal reflexes. No cranial nerve deficit. She exhibits normal muscle tone. Coordination normal.  Skin: Skin is warm and dry. No rash noted. No erythema. No pallor.  Solar lentigines diffusely Few sks  Psychiatric: She has a normal mood and affect. Her mood appears not anxious. She does not exhibit a depressed mood.  Pleasant           Assessment & Plan:   Problem List Items Addressed This Visit      Musculoskeletal and Integument   Osteopenia    Due for dexa (last 2014) Ordered   Disc need for calcium/ vitamin D/ wt bearing exercise and bone density test every 2 y to monitor Disc safety/ fracture risk in detail   No falls or fx         Other   Depression, major, recurrent (HCC)    Doing well with zoloft currently  Grief after loosing brother this summer - handling it well Reviewed stressors/ coping  techniques/symptoms/ support sources/ tx options and side effects in detail today Enc good self care      Relevant Medications   sertraline (ZOLOFT) 100 MG tablet   Encounter for hepatitis C screening test for low risk patient    Hep C screen with labs      Relevant Orders   Hepatitis C antibody   Encounter for routine gynecological examination    Routine exam with pap  Tight introitus - difficult to fully vis cervix No c/o Pap sent      Relevant Orders   Cytology - PAP   Encounter for screening for HIV    HIV screen with labs Low risk      Relevant Orders   HIV Antibody (routine testing w rflx)   Estrogen deficiency   Relevant Orders   DG Bone Density   Overweight (BMI 25.0-29.9)    Discussed how this problem influences overall health and the risks it imposes  Reviewed plan for weight loss with lower calorie diet (via better food choices and also portion control or program  like weight watchers) and exercise building up to or more than 30 minutes 5 days per week including some aerobic activity   Disc diet/exercise efforts       PURE HYPERCHOLESTEROLEMIA    Disc goals for lipids and reasons to control them Rev last labs with pt Rev low sat fat diet in detail  Lab today  Diet is fair      Relevant Orders   Lipid panel   Routine general medical examination at a health care facility - Primary    Reviewed health habits including diet and exercise and skin cancer prevention Reviewed appropriate screening tests for age  Also reviewed health mt list, fam hx and immunization status , as well as social and family history   See HPI Labs ordered Hep C / HIV screening  Gyn exam and pap done  dexa ordered  Disc imp of ca and D for bone health  F/u with dermatology for skin screen as planned       Relevant Orders   CBC with Differential/Platelet   Comprehensive metabolic panel   Lipid panel   TSH

## 2018-03-22 NOTE — Assessment & Plan Note (Signed)
HIV screen with labs Low risk

## 2018-03-22 NOTE — Patient Instructions (Addendum)
Think about increasing exercise to help mood and general health   Also keep working on a healthy diet   Try to get 1200-1500 mg of calcium per day with at least 1000 iu of vitamin D - for bone health  We will set up bone density test   See dermatology as planned  Pap today  Labs today

## 2018-03-22 NOTE — Assessment & Plan Note (Signed)
Discussed how this problem influences overall health and the risks it imposes  Reviewed plan for weight loss with lower calorie diet (via better food choices and also portion control or program like weight watchers) and exercise building up to or more than 30 minutes 5 days per week including some aerobic activity   Disc diet/exercise efforts

## 2018-03-22 NOTE — Assessment & Plan Note (Signed)
Routine exam with pap  Tight introitus - difficult to fully vis cervix No c/o Pap sent

## 2018-03-22 NOTE — Assessment & Plan Note (Signed)
Due for dexa (last 2014) Ordered   Disc need for calcium/ vitamin D/ wt bearing exercise and bone density test every 2 y to monitor Disc safety/ fracture risk in detail   No falls or fx

## 2018-03-22 NOTE — Assessment & Plan Note (Signed)
Doing well with zoloft currently  Grief after loosing brother this summer - handling it well Reviewed stressors/ coping techniques/symptoms/ support sources/ tx options and side effects in detail today Enc good self care

## 2018-03-23 LAB — HEPATITIS C ANTIBODY
HEP C AB: NONREACTIVE
SIGNAL TO CUT-OFF: 0.02 (ref <1.00)

## 2018-03-23 LAB — HIV ANTIBODY (ROUTINE TESTING W REFLEX): HIV 1&2 Ab, 4th Generation: NONREACTIVE

## 2018-03-27 LAB — CYTOLOGY - PAP
DIAGNOSIS: NEGATIVE
HPV (WINDOPATH): NOT DETECTED

## 2018-03-28 ENCOUNTER — Telehealth: Payer: Self-pay | Admitting: Family Medicine

## 2018-03-28 NOTE — Telephone Encounter (Signed)
Left message asking pt to call office regarding bone density.   °

## 2018-05-15 ENCOUNTER — Ambulatory Visit
Admission: RE | Admit: 2018-05-15 | Discharge: 2018-05-15 | Disposition: A | Discharge status: Home / Self Care | Payer: BC Managed Care – PPO | Source: Ambulatory Visit | Attending: Family Medicine | Admitting: Family Medicine

## 2018-05-15 DIAGNOSIS — E2839 Other primary ovarian failure: Secondary | ICD-10-CM | POA: Diagnosis present

## 2018-05-19 ENCOUNTER — Encounter: Payer: Self-pay | Admitting: Family Medicine

## 2018-07-02 ENCOUNTER — Encounter: Payer: Self-pay | Admitting: Family Medicine

## 2018-07-04 MED ORDER — HYDROCORTISONE 2.5 % RE CREA: 1.0000 "application " | TOPICAL_CREAM | Freq: Two times a day (BID) | RECTAL | 0 refills | Status: DC | PRN

## 2018-07-04 NOTE — Telephone Encounter (Signed)
Please let her know I sent a hemorrhoid cream to her pharmacy

## 2018-07-07 ENCOUNTER — Encounter: Payer: Self-pay | Admitting: Family Medicine

## 2018-07-08 ENCOUNTER — Encounter: Payer: Self-pay | Admitting: Family Medicine

## 2018-07-10 ENCOUNTER — Other Ambulatory Visit: Payer: Self-pay | Admitting: Family Medicine

## 2018-07-10 ENCOUNTER — Ambulatory Visit: Payer: BC Managed Care – PPO | Admitting: Family Medicine

## 2018-07-10 ENCOUNTER — Encounter: Payer: Self-pay | Admitting: Family Medicine

## 2018-07-10 VITALS — BP 124/72 | HR 64 | Temp 98.7°F | Ht 64.25 in | Wt 179.2 lb

## 2018-07-10 DIAGNOSIS — F330 Major depressive disorder, recurrent, mild: Secondary | ICD-10-CM

## 2018-07-10 DIAGNOSIS — M546 Pain in thoracic spine: Secondary | ICD-10-CM | POA: Diagnosis not present

## 2018-07-10 DIAGNOSIS — Z1231 Encounter for screening mammogram for malignant neoplasm of breast: Secondary | ICD-10-CM

## 2018-07-10 MED ORDER — METHOCARBAMOL 500 MG PO TABS: 500.0000 mg | ORAL_TABLET | Freq: Three times a day (TID) | ORAL | 0 refills | Status: DC | PRN

## 2018-07-10 MED ORDER — MELOXICAM 15 MG PO TABS: 15.0000 mg | ORAL_TABLET | Freq: Every day | ORAL | 1 refills | Status: DC

## 2018-07-10 NOTE — Assessment & Plan Note (Signed)
L sided /rhomboid area - exacerbated by neck movement and improved with rhomboid stretch Unsure if this is the cause of LUE symptoms (no neck pain)  tx with meloxicam 15 mg and robaxin tid prn with caution of sedation Stretching /heat if helpful  Update if not starting to improve in a week or if worsening   Consider course of prednisone plus/minus imaging if no improvement

## 2018-07-10 NOTE — Patient Instructions (Addendum)
Heat may help (10 minutes at a time to painful area)  Stretch your neck and also try the rhomboid stretch we did  Take meloxicam 15 mg daily with food for 1-2 weeks  Stop if GI upset   Try the robaxin (muscle relaxer) if not work/driving   Alert [place] if not much improved in 1-2 weeks  We can consider prednisone for a short time    Try to get 1200-1500 mg of calcium per day with at least 2000 iu of vitamin D - for bone health

## 2018-07-10 NOTE — Progress Notes (Signed)
Subjective:    Patient ID: Emma Knight, female    DOB: 09-22-1955, 63 y.o.   MRN: [id number]  HPI  Here for L neck/shoulder and arm pain   Per pt she has a hx of a herniated disc (cervical spine) -years ago (thinks she had MRI)  In the past treated with prednisone and traction  Pain was severe then    Wt Readings from Last 3 Encounters:  07/10/18 179 lb 3 oz (81.3 kg)  03/22/18 174 lb (78.9 kg)  05/14/17 170 lb (77.1 kg)   30.52 kg/m   Less than a week ago pain under L scapula  Then pain in her L arm  Middle and 4th finger - radiates up arm  A little numb feeling on underside of L upper arm  No weakness   Neck is not hurting (it catches occasionally)  Flexion of neck can bring on the scapula pain     Tried heat Es tylenol  advil (helps the most but only for 2 hours or so)   Symptoms can come and go   Driving bothers her more  Chair at work- worse as well (? If it throws her too far back)  Hard to get comfortable    Last CS xray  DG Cervical Spine Complete (Accession [id number]) (Order [id number])  Imaging  Date: 12/20/2015 Department: [facility] Ordering/Authorizing: Doreene Nest, NP  Exam Information   Status Exam Begun  Exam Ended   Final [[id number]] 12/20/2015 11:04 AM 12/20/2015 11:05 AM  PACS Images   Show images for DG Cervical Spine Complete  Study Result   CLINICAL DATA:  Left upper extremity numbness for 1 month after fall.  EXAM: CERVICAL SPINE - COMPLETE 4+ VIEW  COMPARISON:  None.  FINDINGS: Degenerative disc disease changes in the mid and lower cervical spine with disc space narrowing and spurring. Normal alignment. Prevertebral soft tissues are normal. Moderate right neural foraminal narrowing at C6-7 due to uncovertebral spurring. No visible left neural foraminal narrowing. No fracture.  IMPRESSION: Degenerative changes as above.  No acute findings.   Electronically Signed   By: Charlett Nose  M.D.   On: 12/20/2015 11:09   Patient Active Problem List   Diagnosis Date Noted  . Thoracic back pain 07/10/2018  . Encounter for hepatitis C screening test for low risk patient 03/22/2018  . Encounter for screening for HIV 03/22/2018  . Estrogen deficiency 03/22/2018  . Hydradenitis 05/14/2017  . Depression, major, recurrent (HCC) 02/25/2016  . Family history of leukemia 03/18/2014  . Encounter for routine gynecological examination 09/20/2012  . Routine general medical examination at a health care facility 09/16/2012  . Other screening mammogram 02/02/2011  . Overweight (BMI 25.0-29.9) 12/30/2009  . PURE HYPERCHOLESTEROLEMIA 03/03/2009  . Osteoporosis 05/20/2008   Past Medical History:  Diagnosis Date  . Hyperlipidemia   . Osteopenia   . Sleep apnea    History reviewed. No pertinent surgical history. Social History   Tobacco Use  . Smoking status: Never Smoker  . Smokeless tobacco: Never Used  Substance Use Topics  . Alcohol use: Yes    Alcohol/week: 0.0 standard drinks    Comment: rarely  . Drug use: No   Family History  Problem Relation Age of Onset  . Bladder Cancer Brother    No Known Allergies Current Outpatient Medications on File Prior to Visit  Medication Sig Dispense Refill  . hydrocortisone (ANUSOL-HC) 2.5 % rectal cream Place 1 application rectally 2 (two)  times daily as needed for hemorrhoids or anal itching. 30 g 0  . sertraline (ZOLOFT) 100 MG tablet Take 1 tablet (100 mg total) by mouth at bedtime. 90 tablet 3   No current facility-administered medications on file prior to visit.     Review of Systems  Constitutional: Negative for activity change, appetite change, fatigue, fever and unexpected weight change.  HENT: Negative for congestion, ear pain, rhinorrhea, sinus pressure and sore throat.   Eyes: Negative for pain, redness and visual disturbance.  Respiratory: Negative for cough, shortness of breath and wheezing.   Cardiovascular: Negative  for chest pain and palpitations.  Gastrointestinal: Negative for abdominal pain, blood in stool, constipation and diarrhea.  Endocrine: Negative for polydipsia and polyuria.  Genitourinary: Negative for dysuria, frequency and urgency.  Musculoskeletal: Positive for back pain. Negative for arthralgias and myalgias.  Skin: Negative for pallor and rash.  Allergic/Immunologic: Negative for environmental allergies.  Neurological: Negative for dizziness, syncope, weakness and headaches.       Tingling in some fingers of L hand and inner upper arm  Hematological: Negative for adenopathy. Does not bruise/bleed easily.  Psychiatric/Behavioral: Negative for decreased concentration and dysphoric mood. The patient is not nervous/anxious.        Objective:   Physical Exam Constitutional:      General: She is not in acute distress.    Appearance: Normal appearance. She is obese. She is not ill-appearing.  HENT:     Head: Normocephalic and atraumatic.     Mouth/Throat:     Mouth: Mucous membranes are moist.     Pharynx: Oropharynx is clear.  Eyes:     Conjunctiva/sclera: Conjunctivae normal.     Pupils: Pupils are equal, round, and reactive to light.  Neck:     Musculoskeletal: Normal range of motion and neck supple. No neck rigidity or muscular tenderness.     Vascular: No carotid bruit.  Cardiovascular:     Rate and Rhythm: Normal rate and regular rhythm.     Heart sounds: Normal heart sounds.  Pulmonary:     Effort: Pulmonary effort is normal. No respiratory distress.     Breath sounds: Normal breath sounds. No wheezing or rales.  Musculoskeletal:        General: Tenderness present. No deformity.     Comments: Tender over musculature medial to L scapula with palpable spasm  Also some trapezius tenderness  No cervical tenderness  Back pain worsens with neck flex and rotation to the L     Lymphadenopathy:     Cervical: No cervical adenopathy.  Skin:    General: Skin is warm and dry.      Findings: No rash.  Neurological:     Mental Status: She is alert.     Cranial Nerves: No cranial nerve deficit.     Motor: No weakness.     Coordination: Coordination normal.     Deep Tendon Reflexes: Reflexes normal.     Comments: Pos tinel for tingling of 3,4th fingers of L hand  Neg phalen  Psychiatric:        Mood and Affect: Mood normal.           Assessment & Plan:   Problem List Items Addressed This Visit      Other   Thoracic back pain - Primary    L sided /rhomboid area - exacerbated by neck movement and improved with rhomboid stretch Unsure if this is the cause of LUE symptoms (no neck pain)  tx  with meloxicam 15 mg and robaxin tid prn with caution of sedation Stretching /heat if helpful  Update if not starting to improve in a week or if worsening   Consider course of prednisone plus/minus imaging if no improvement        Relevant Medications   meloxicam (MOBIC) 15 MG tablet   methocarbamol (ROBAXIN) 500 MG tablet   Mild episode of recurrent major depressive disorder (HCC)    Doing well currently with sertraline

## 2018-07-10 NOTE — Assessment & Plan Note (Signed)
Doing well currently with sertraline

## 2018-07-16 ENCOUNTER — Encounter: Payer: Self-pay | Admitting: Family Medicine

## 2018-07-17 ENCOUNTER — Encounter: Payer: Self-pay | Admitting: Family Medicine

## 2018-07-19 MED ORDER — TRAMADOL HCL 50 MG PO TABS: 50.0000 mg | ORAL_TABLET | Freq: Three times a day (TID) | ORAL | 0 refills | Status: AC | PRN

## 2018-07-19 MED ORDER — PREDNISONE 20 MG PO TABS: ORAL_TABLET | ORAL | 0 refills | Status: DC

## 2018-07-19 NOTE — Telephone Encounter (Signed)
Prednisone and tramadol sent

## 2018-07-19 NOTE — Telephone Encounter (Signed)
Pt left v/m; has been messaging with Dr Milinda Antis; pt request if prednisone will be sent in today 07/19/18 please send to [facility] pharmacy and if not sent in today for the weekend please send to [facility].

## 2018-07-22 ENCOUNTER — Telehealth: Payer: Self-pay | Admitting: Family Medicine

## 2018-07-22 MED ORDER — SERTRALINE HCL 100 MG PO TABS: 100.0000 mg | ORAL_TABLET | Freq: Every day | ORAL | 3 refills | Status: DC

## 2018-07-22 NOTE — Telephone Encounter (Signed)
Done  Please let her know 

## 2018-07-22 NOTE — Telephone Encounter (Signed)
Pt need refill for    Sertraline  100mg    Sent to [facility] instead of [facility],

## 2018-08-02 ENCOUNTER — Other Ambulatory Visit: Payer: Self-pay

## 2018-08-02 ENCOUNTER — Ambulatory Visit (INDEPENDENT_AMBULATORY_CARE_PROVIDER_SITE_OTHER): Payer: BC Managed Care – PPO | Admitting: Family Medicine

## 2018-08-02 ENCOUNTER — Telehealth: Payer: Self-pay

## 2018-08-02 ENCOUNTER — Encounter: Payer: Self-pay | Admitting: Family Medicine

## 2018-08-02 DIAGNOSIS — M546 Pain in thoracic spine: Secondary | ICD-10-CM

## 2018-08-02 MED ORDER — PREDNISONE 20 MG PO TABS: ORAL_TABLET | ORAL | 0 refills | Status: DC

## 2018-08-02 MED ORDER — METHOCARBAMOL 500 MG PO TABS: 500.0000 mg | ORAL_TABLET | Freq: Three times a day (TID) | ORAL | 1 refills | Status: DC | PRN

## 2018-08-02 NOTE — Patient Instructions (Signed)
Ref to orthopedics Repeat prednisone Alert [place] if symptoms worsen

## 2018-08-02 NOTE — Assessment & Plan Note (Signed)
Ongoing and worsening  Now with more tingling in the arm  Better with 9 days of prednisone and then symptoms returned Methocarbamol helps a little  Tramadol as well but not much  Would like to keep her out of multiple offices /may need imaging Will repeat prednisone taper  Refill methocarbamol (has 2 left)  Ref to orthopedics for further eval

## 2018-08-02 NOTE — Telephone Encounter (Signed)
Tried to return the call with no answer.

## 2018-08-02 NOTE — Telephone Encounter (Signed)
Carrie Please schedule Webex visit

## 2018-08-02 NOTE — Telephone Encounter (Signed)
Received IBC from patient regarding recurrent back, left shoulder blade, and left arm pain. Pain scale: 7/10. Increased pain when sitting upright.   Taking Tramadol, ES Tylenol, and Methocarbamol as directed.   Patient desired outcome: decreased pain  Patient is able to complete Webex and can be reached at [phone].

## 2018-08-02 NOTE — Progress Notes (Signed)
Virtual Visit via Video Note  I connected with Emma Knight on 08/02/18 at 10:15 AM EDT by a video enabled telemedicine application and verified that I am speaking with the correct person using two identifiers. She is at home and I am in the office for the encounter    I discussed the limitations of evaluation and management by telemedicine and the availability of in person appointments. The patient expressed understanding and agreed to proceed.  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  Eyes: Negative for blurred vision and discharge.  Respiratory: Negative for cough, hemoptysis and shortness of breath.   Cardiovascular: Negative for chest pain and leg swelling.  Gastrointestinal: Negative for diarrhea, nausea and vomiting.  Musculoskeletal: Positive for back pain and myalgias.  Skin: Negative for itching and rash.  Neurological: Positive for tingling. Negative for focal weakness.  Psychiatric/Behavioral: Negative for depression.    Patient Active Problem List   Diagnosis Date Noted  . Thoracic back  pain 07/10/2018  . Mild episode of recurrent major depressive disorder (HCC) 07/10/2018  . Encounter for hepatitis C screening test for low risk patient 03/22/2018  . Encounter for screening for HIV 03/22/2018  . Estrogen deficiency 03/22/2018  . Hydradenitis 05/14/2017  . Depression, major, recurrent (HCC) 02/25/2016  . Family history of leukemia 03/18/2014  . Encounter for routine gynecological examination 09/20/2012  . Routine general medical examination at a health care facility 09/16/2012  . Other screening mammogram 02/02/2011  . Overweight (BMI 25.0-29.9) 12/30/2009  . PURE HYPERCHOLESTEROLEMIA 03/03/2009  . Osteoporosis 05/20/2008   Past Medical History:  Diagnosis Date  . Hyperlipidemia   . Osteopenia   . Sleep apnea    No past surgical history on file. Social History   Tobacco Use  . Smoking status: Never Smoker  . Smokeless tobacco: Never Used  Substance Use Topics  . Alcohol use: Yes    Alcohol/week: 0.0 standard drinks    Comment: rarely  . Drug use: No   Family History  Problem Relation Age of Onset  . Bladder Cancer Brother    No Known Allergies Current Outpatient Medications on File Prior to Visit  Medication Sig Dispense Refill  . acetaminophen (TYLENOL) 500 MG tablet Take 500 mg by mouth every 6 (six) hours as needed.    . sertraline (ZOLOFT) 100 MG tablet Take 1 tablet (100 mg total) by mouth at bedtime. 90 tablet 3  No current facility-administered medications on file prior to visit.     Observations/Objective: Pt looks like her normal self/not in distress Voice is not hoarse  No apparent weight change  Sitting for our encounter Able to reach hands above her head  Able to grip w/o problems   Assessment and Plan: Problem List Items Addressed This Visit      Other   Thoracic back pain - Primary    Ongoing and worsening  Now with more tingling in the arm  Better with 9 days of prednisone and then symptoms returned Methocarbamol helps a  little  Tramadol as well but not much  Would like to keep her out of multiple offices /may need imaging Will repeat prednisone taper  Refill methocarbamol (has 2 left)  Ref to orthopedics for further eval       Relevant Medications   acetaminophen (TYLENOL) 500 MG tablet   methocarbamol (ROBAXIN) 500 MG tablet   predniSONE (DELTASONE) 20 MG tablet   Other Relevant Orders   Ambulatory referral to Orthopedic Surgery       Follow Up Instructions: Take prednisone taper as directed (60 mg)  Other medicines as needed with caution of sedation  Ice/heat if helpful  We will call you regarding referral to orthopedics for further evaluation      I discussed the assessment and treatment plan with the patient. The patient was provided an opportunity to ask questions and all were answered. The patient agreed with the plan and demonstrated an understanding of the instructions.   The patient was advised to call back or seek an in-person evaluation if the symptoms worsen or if the condition fails to improve as anticipated.  I provided 15 minutes of non-face-to-face time during this encounter.   Roxy Manns, MD

## 2018-08-02 NOTE — Telephone Encounter (Signed)
Pt left vm returning a call °

## 2018-08-15 ENCOUNTER — Other Ambulatory Visit: Payer: Self-pay | Admitting: Orthopedic Surgery

## 2018-08-15 DIAGNOSIS — R2990 Unspecified symptoms and signs involving the nervous system: Secondary | ICD-10-CM

## 2018-08-16 ENCOUNTER — Encounter: Payer: Self-pay | Admitting: Family Medicine

## 2018-08-23 ENCOUNTER — Other Ambulatory Visit: Payer: Self-pay | Admitting: Orthopedic Surgery

## 2018-08-23 DIAGNOSIS — M5412 Radiculopathy, cervical region: Secondary | ICD-10-CM

## 2018-08-24 ENCOUNTER — Ambulatory Visit
Admission: RE | Admit: 2018-08-24 | Discharge: 2018-08-24 | Disposition: A | Discharge status: Home / Self Care | Payer: BC Managed Care – PPO | Source: Ambulatory Visit | Attending: Orthopedic Surgery | Admitting: Orthopedic Surgery

## 2018-08-24 ENCOUNTER — Other Ambulatory Visit: Payer: Self-pay

## 2018-08-24 DIAGNOSIS — M5412 Radiculopathy, cervical region: Secondary | ICD-10-CM

## 2018-09-18 ENCOUNTER — Ambulatory Visit: Admission: RE | Admit: 2018-09-18 | Payer: BC Managed Care – PPO | Source: Ambulatory Visit

## 2018-11-06 ENCOUNTER — Ambulatory Visit
Admission: RE | Admit: 2018-11-06 | Discharge: 2018-11-06 | Disposition: A | Discharge status: Home / Self Care | Payer: BC Managed Care – PPO | Source: Ambulatory Visit | Attending: Family Medicine | Admitting: Family Medicine

## 2018-11-06 ENCOUNTER — Other Ambulatory Visit: Payer: Self-pay

## 2018-11-06 DIAGNOSIS — Z1231 Encounter for screening mammogram for malignant neoplasm of breast: Secondary | ICD-10-CM | POA: Diagnosis present

## 2019-06-04 IMAGING — MR MRI CERVICAL SPINE WITHOUT CONTRAST
5 series · 38 of 48 positions shown · non-contrast
Comparison: Cervical spine radiographs 12/20/2015.

CLINICAL DATA: 63-year-old female with pain since late [REDACTED]
radiating under the left scapula, down the left arm into the
fingers. Recurrent symptoms. No known injury.

EXAM:
MRI CERVICAL SPINE WITHOUT CONTRAST
TECHNIQUE: Multiplanar, multisequence MR imaging of the cervical spine was
performed. No intravenous contrast was administered.

[Series 5: T2 · sagittal · 3.0mm · 0.62mm/px · 8 of 15 slices shown (1 of 2)]
[im 1/15]
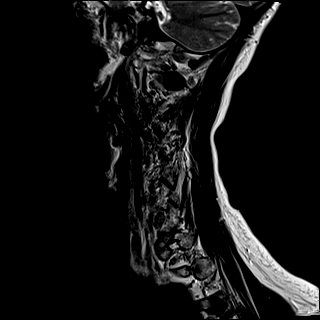
[im 3/15]
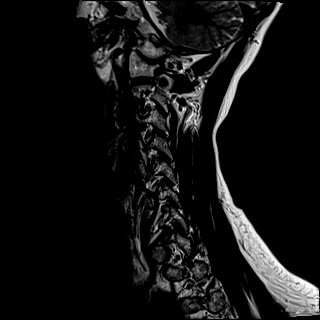
[im 5/15]
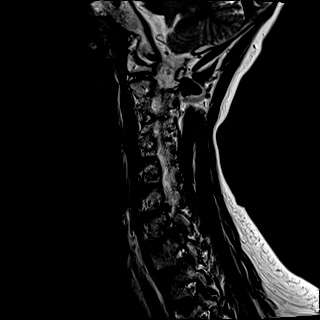
[im 7/15]
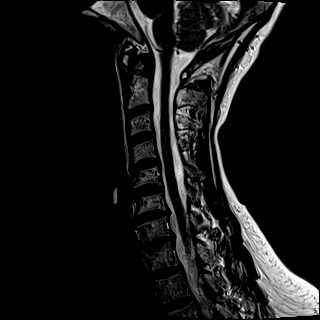
[im 9/15]
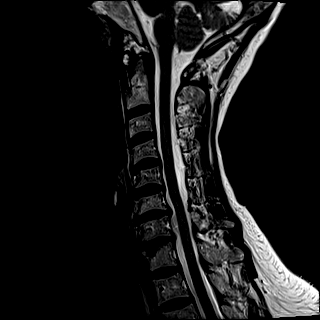
[im 11/15]
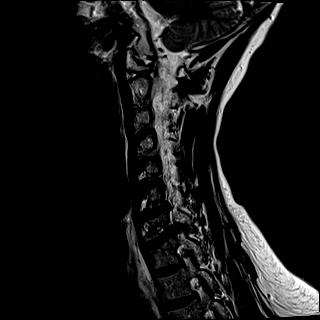
[im 13/15]
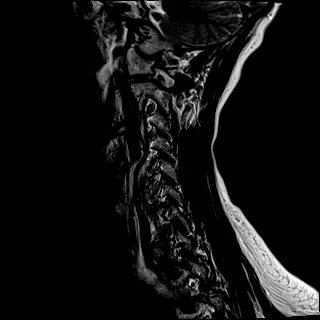
[im 15/15]
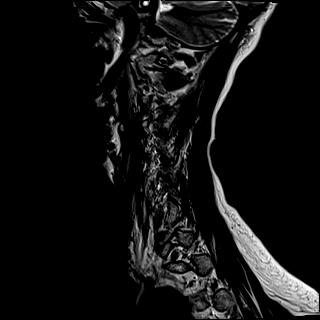

[Series 6: FLAIR · sagittal · 3.0mm · 0.78mm/px · 7 of 15 slices shown]
[im 1/15]
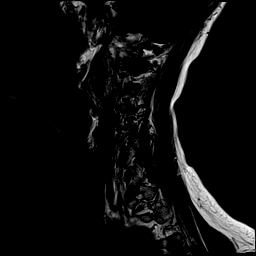
[im 3/15]
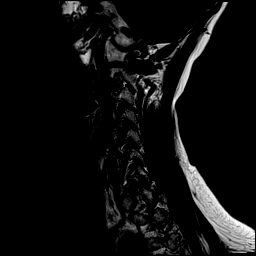
[im 5/15]
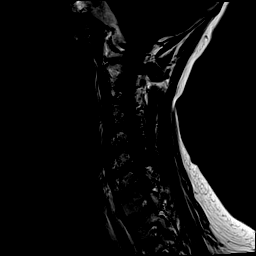
[im 8/15]
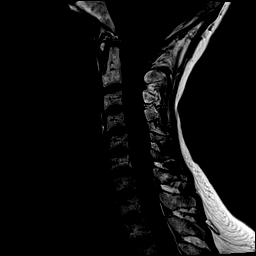
[im 10/15]
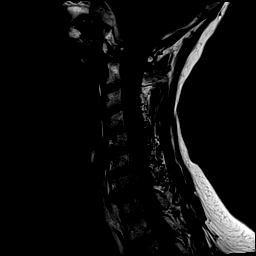
[im 12/15]
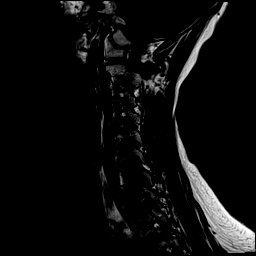
[im 15/15]
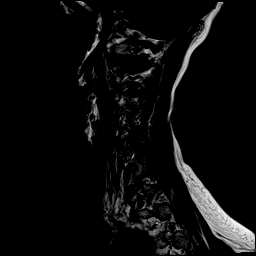

[Series 8: ax mpgr · axial · 3.0mm · 0.35mm/px · z∈[-29,+51]mm · 7 of 29 slices shown]
[im 1/29]
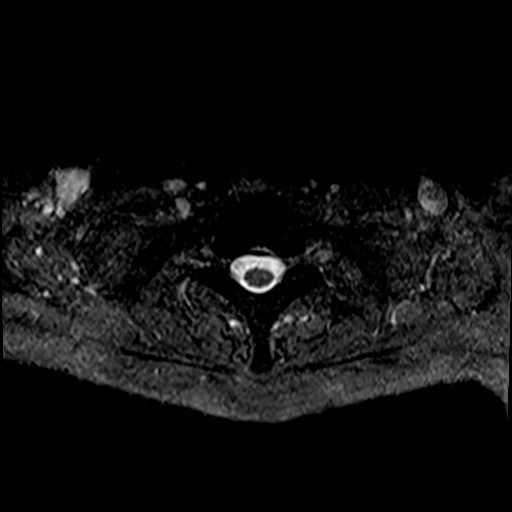
[im 5/29]
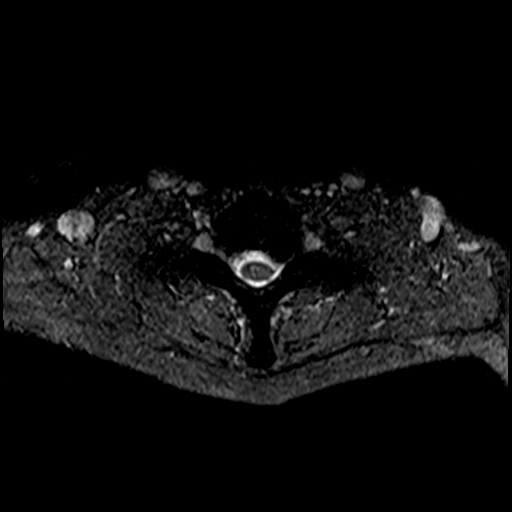
[im 9/29]
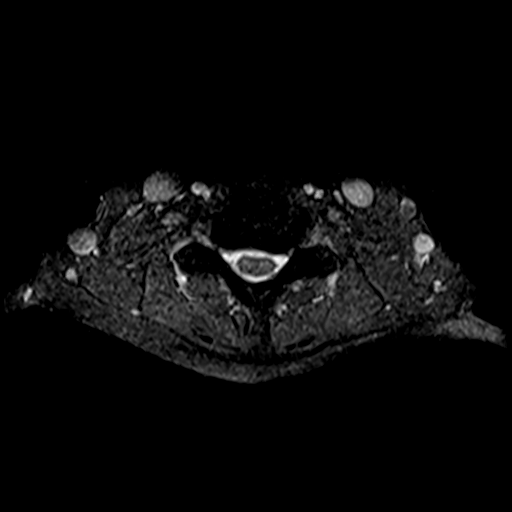
[im 13/29]
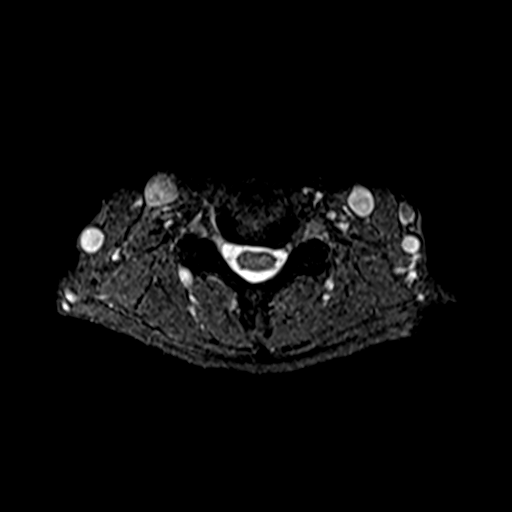
[im 16/29]
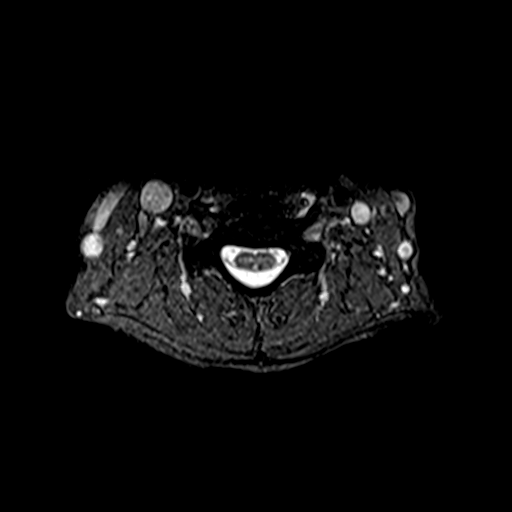
[im 20/29]
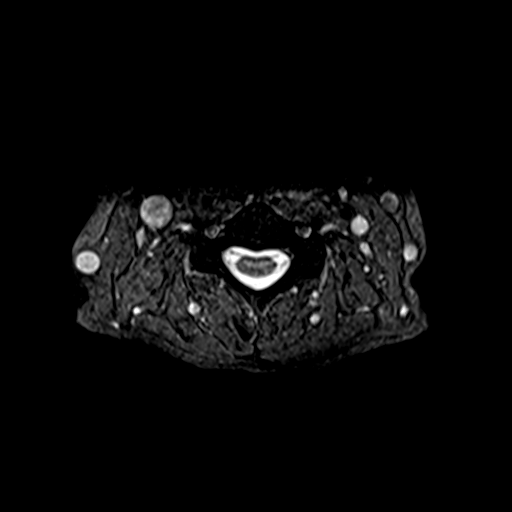
[im 24/29]
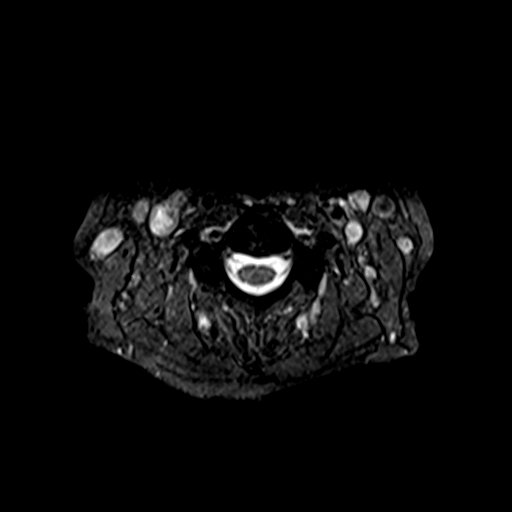

[Series 9: STIR · sagittal · 3.0mm · 0.62mm/px · 7 of 15 slices shown]
[im 1/15]
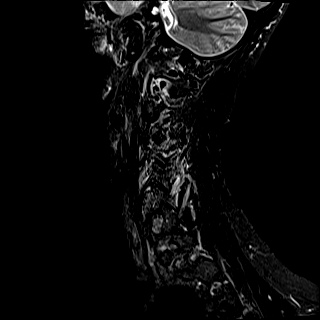
[im 3/15]
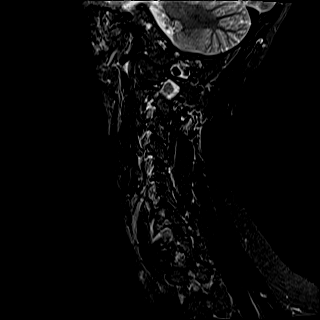
[im 5/15]
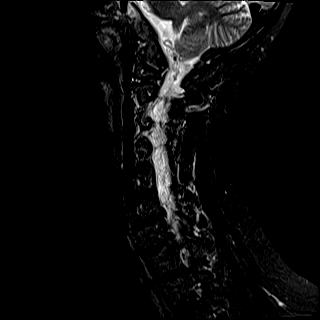
[im 8/15]
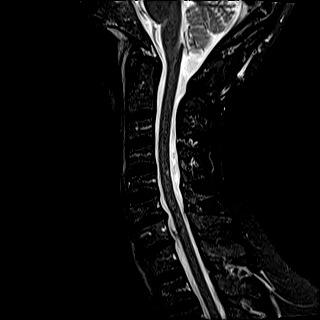
[im 10/15]
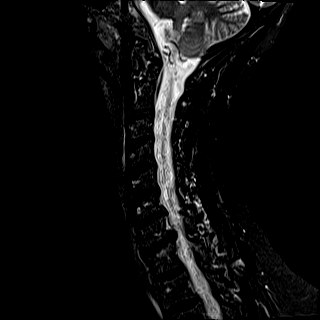
[im 12/15]
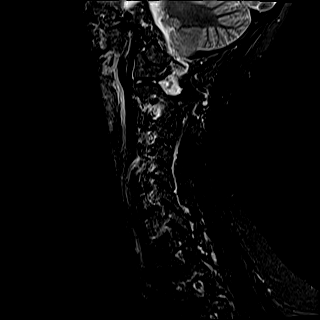
[im 15/15]
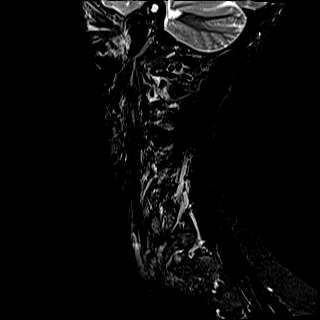

[Series 10: T2 · axial · 3.0mm · 0.70mm/px · z∈[-29,+68]mm · 9 of 24 slices shown (2 of 2)]
[im 1/24]
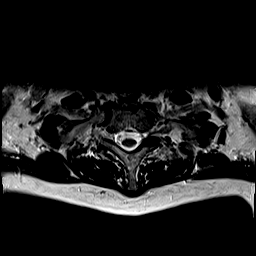
[im 5/24]
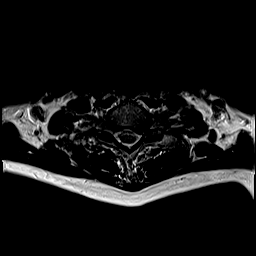
[im 7/24]
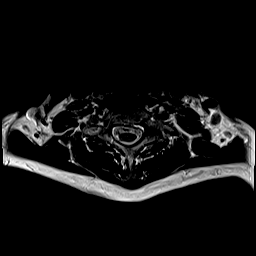
[im 11/24]
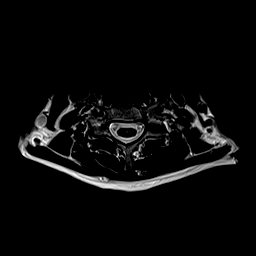
[im 13/24]
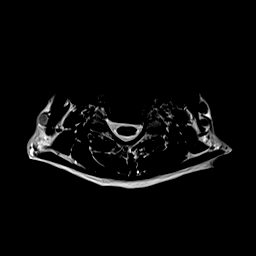
[im 17/24]
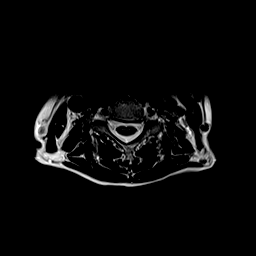
[im 19/24]
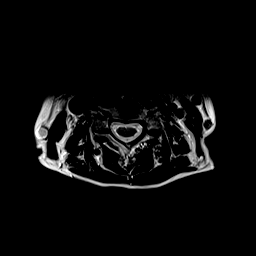
[im 21/24]
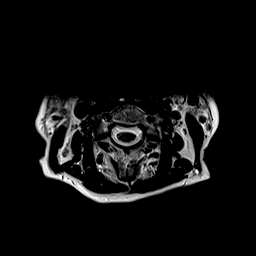
[im 24/24]
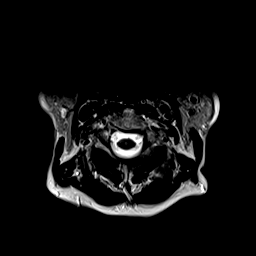

[38 of 48 positions shown; findings below may reference images not displayed]

FINDINGS: Alignment: Straightening of cervical lordosis stable since 7482.

Vertebrae: No marrow edema or evidence of acute osseous abnormality.
Chronic T1 superior endplate Schmorl's node also appears stable from
the prior radiographs. Normal background bone marrow signal.

Cord: Solitary 2 millimeter area of dorsal column T2 and STIR
hyperintensity in the spinal cord at the superior C5 level seen on
both sagittal and axial images (series 9, image 8 and series 10,
image 14). Otherwise visible cord signal and morphology are normal.
No cord expansion.

Posterior Fossa, vertebral arteries, paraspinal tissues:
Cervicomedullary junction is within normal limits. Negative visible
posterior fossa. Preserved major vascular flow voids in the neck.
Negative visible neck soft tissues and lung apices.

Disc levels:

C2-C3:  Negative.

C3-C4:  Negative.

C4-C5: Subtle chronic anterolisthesis at this level but only mild
anterior disc osteophyte complex and no facet arthropathy. No spinal
or foraminal stenosis.

C5-C6: Mild anterior and left foraminal disc bulging and endplate
spurring. No spinal stenosis. Mild to moderate left C6 foraminal
stenosis.

C6-C7: Circumferential disc osteophyte complex with broad-based
posterior component. Mild ligament flavum hypertrophy. No spinal
stenosis. Moderate to severe bilateral C7 foraminal stenosis appears
greater on the left.

C7-T1: Circumferential disc bulge and endplate spurring with bulky
left foraminal involvement (series 5, image 12 and series 10, image
25). Moderate involvement of the contralateral right C8 neural
foramen. Mild facet and ligament flavum hypertrophy but no spinal
stenosis.

No upper thoracic spinal stenosis.
IMPRESSION: 1. Isolated small 2 mm area of dorsal column signal abnormality in
the spinal cord at the superior C5 level. This is of unclear
etiology and significance, with no degenerative stenosis at that
level.
2. Lower cervical disc and endplate degeneration with no spinal
stenosis, but moderate to severe bilateral C7 and C8 neural
foraminal stenosis.
Query left C8 radiculitis, with bulky involvement of that neural
foramen.
Lesser C5-C6 disc and endplate degeneration with mild to moderate
left C6 foraminal stenosis.

## 2019-07-16 ENCOUNTER — Other Ambulatory Visit: Payer: Self-pay | Admitting: Family Medicine

## 2019-07-23 ENCOUNTER — Encounter: Payer: Self-pay | Admitting: Family Medicine

## 2019-07-23 ENCOUNTER — Other Ambulatory Visit: Payer: Self-pay

## 2019-07-23 ENCOUNTER — Ambulatory Visit (INDEPENDENT_AMBULATORY_CARE_PROVIDER_SITE_OTHER): Payer: Medicare PPO | Admitting: Family Medicine

## 2019-07-23 VITALS — BP 112/74 | HR 68 | Temp 96.9°F | Ht 64.25 in | Wt 193.1 lb

## 2019-07-23 DIAGNOSIS — E7800 Pure hypercholesterolemia, unspecified: Secondary | ICD-10-CM | POA: Diagnosis not present

## 2019-07-23 DIAGNOSIS — Z23 Encounter for immunization: Secondary | ICD-10-CM | POA: Diagnosis not present

## 2019-07-23 DIAGNOSIS — Z0000 Encounter for general adult medical examination without abnormal findings: Secondary | ICD-10-CM | POA: Diagnosis not present

## 2019-07-23 DIAGNOSIS — F330 Major depressive disorder, recurrent, mild: Secondary | ICD-10-CM | POA: Diagnosis not present

## 2019-07-23 DIAGNOSIS — M810 Age-related osteoporosis without current pathological fracture: Secondary | ICD-10-CM | POA: Diagnosis not present

## 2019-07-23 DIAGNOSIS — E669 Obesity, unspecified: Secondary | ICD-10-CM

## 2019-07-23 LAB — CBC WITH DIFFERENTIAL/PLATELET
Basophils Absolute: 0 10*3/uL (ref 0.0–0.1)
Basophils Relative: 0.8 % (ref 0.0–3.0)
Eosinophils Absolute: 0 10*3/uL (ref 0.0–0.7)
Eosinophils Relative: 0.5 % (ref 0.0–5.0)
HCT: 36.3 % (ref 36.0–46.0)
Hemoglobin: 12.1 g/dL (ref 12.0–15.0)
Lymphocytes Relative: 46.5 % — ABNORMAL HIGH (ref 12.0–46.0)
Lymphs Abs: 1.6 10*3/uL (ref 0.7–4.0)
MCHC: 33.2 g/dL (ref 30.0–36.0)
MCV: 95 fl (ref 78.0–100.0)
Monocytes Absolute: 0.3 10*3/uL (ref 0.1–1.0)
Monocytes Relative: 9.7 % (ref 3.0–12.0)
Neutro Abs: 1.5 10*3/uL (ref 1.4–7.7)
Neutrophils Relative %: 42.5 % — ABNORMAL LOW (ref 43.0–77.0)
Platelets: 215 10*3/uL (ref 150.0–400.0)
RBC: 3.83 Mil/uL — ABNORMAL LOW (ref 3.87–5.11)
RDW: 13.7 % (ref 11.5–15.5)
WBC: 3.4 10*3/uL — ABNORMAL LOW (ref 4.0–10.5)

## 2019-07-23 LAB — COMPREHENSIVE METABOLIC PANEL
ALT: 14 U/L (ref 0–35)
AST: 15 U/L (ref 0–37)
Albumin: 4 g/dL (ref 3.5–5.2)
Alkaline Phosphatase: 57 U/L (ref 39–117)
BUN: 9 mg/dL (ref 6–23)
CO2: 30 mEq/L (ref 19–32)
Calcium: 9 mg/dL (ref 8.4–10.5)
Chloride: 103 mEq/L (ref 96–112)
Creatinine, Ser: 0.56 mg/dL (ref 0.40–1.20)
GFR: 108.62 mL/min (ref >60.00)
Glucose, Bld: 91 mg/dL (ref 70–99)
Potassium: 4.2 mEq/L (ref 3.5–5.1)
Sodium: 138 mEq/L (ref 135–145)
Total Bilirubin: 0.4 mg/dL (ref 0.2–1.2)
Total Protein: 6.5 g/dL (ref 6.0–8.3)

## 2019-07-23 LAB — LIPID PANEL
Cholesterol: 243 mg/dL — ABNORMAL HIGH (ref 0–200)
HDL: 85.2 mg/dL (ref >39.00)
LDL Cholesterol: 149 mg/dL — ABNORMAL HIGH (ref 0–99)
NonHDL: 158.21
Total CHOL/HDL Ratio: 3
Triglycerides: 47 mg/dL (ref 0.0–149.0)
VLDL: 9.4 mg/dL (ref 0.0–40.0)

## 2019-07-23 LAB — TSH: TSH: 1.78 u[IU]/mL (ref 0.35–4.50)

## 2019-07-23 MED ORDER — ALENDRONATE SODIUM 70 MG PO TABS: 70.0000 mg | ORAL_TABLET | ORAL | 3 refills | Status: DC

## 2019-07-23 MED ORDER — HYDROCORTISONE (PERIANAL) 2.5 % EX CREA: 1.0000 "application " | TOPICAL_CREAM | Freq: Two times a day (BID) | CUTANEOUS | 1 refills | Status: DC | PRN

## 2019-07-23 NOTE — Assessment & Plan Note (Signed)
Pt continues to do well with sertraline 100 mg  Enc good self care including exercise

## 2019-07-23 NOTE — Assessment & Plan Note (Signed)
Due for labs Per pt diet has improved  No known atherosclerosis

## 2019-07-23 NOTE — Progress Notes (Signed)
Subjective:    Patient ID: Emma Knight, female    DOB: 01-15-1956, 64 y.o.   MRN: [id number]  This visit occurred during the SARS-CoV-2 public health emergency.  Safety protocols were in place, including screening questions prior to the visit, additional usage of staff PPE, and extensive cleaning of exam room while observing appropriate contact time as indicated for disinfecting solutions.    HPI Pt presents for welcome to medicare preventative visit   I have personally reviewed the Medicare Annual Wellness questionnaire and have noted 1. The patient's medical and social history 2. Their use of alcohol, tobacco or illicit drugs 3. Their current medications and supplements 4. The patient's functional ability including ADL's, fall risks, home safety risks and hearing or visual             impairment. 5. Diet and physical activities 6. Evidence for depression or mood disorders  The patients weight, height, BMI have been recorded in the chart and visual acuity is per eye clinic.  I have made referrals, counseling and provided education to the patient based review of the above and I have provided the pt with a written personalized care plan for preventive services. Reviewed and updated provider list, see scanned forms.  Has felt ok  Working out of the house    See scanned forms.  Routine anticipatory guidance given to patient.  See health maintenance. Colon cancer screening : colonoscopy - was 3/20 (in [place]) -we need a report  Breast cancer screening  Mammogram 7/20  Self breast exam -no lumps  Flu vaccine- 9/20 Tetanus vaccine 11/15 Tdap  Pneumovax-will get prevnar vaccine today Had both covid vaccines  Pap 11/19 -nl with neg HPV Zoster vaccine-has not had / interested in shingrix if covered  Dexa 1/20 -osteoporosis in FN and forearm Does have hx of fx in the past Falls-none new Fractures-none new  Supplements-not good about ca or D  Exercise -not much exercise since  pandemic  Has not looked into home exercise  Advance directive-has a living will and POA (power of attorney is daughter Emma Knight)  Cognitive function addressed- see scanned forms- and if abnormal then additional documentation follows.  No issues at all-doing fine   PMH and SH reviewed  Meds, vitals, and allergies reviewed.   ROS: See HPI.  Otherwise negative.    Specialists involved in her care : Dr Phylliss Bob   Weight : Wt Readings from Last 3 Encounters:  07/23/19 193 lb 1 oz (87.6 kg)  07/10/18 179 lb 3 oz (81.3 kg)  03/22/18 174 lb (78.9 kg)  not exercising  Diet has been good (was off track for a while)  32.88 kg/m   Hearing/vision:  Hearing Screening   125Hz  250Hz  500Hz  1000Hz  2000Hz  3000Hz  4000Hz  6000Hz  8000Hz   Right ear:   40 40 40  40    Left ear:   40 40 40  40      Visual Acuity Screening   Right eye Left eye Both eyes  Without correction:     With correction: 20/25 20/20 20/20   has an eye exam next month   H/o depression  Takes sertraline -doing well / staying positive   BP Readings from Last 3 Encounters:  07/23/19 112/74  07/10/18 124/72  03/22/18 130/78   Pulse Readings from Last 3 Encounters:  07/23/19 68  07/10/18 64  03/22/18 (!) 58   Hyperlipidemia  Due for labs  Lab Results  Component Value Date   CHOL 225 (H)  03/22/2018   HDL 93.10 03/22/2018   LDLCALC 121 (H) 03/22/2018   LDLDIRECT 130.7 08/05/2010   TRIG 54.0 03/22/2018   CHOLHDL 2 03/22/2018   Diet is improved (was off track prior)   Gave blood last week-she may be a little anemic    Patient Active Problem List   Diagnosis Date Noted  . Welcome to Medicare preventive visit 07/23/2019  . Thoracic back pain 07/10/2018  . Encounter for hepatitis C screening test for low risk patient 03/22/2018  . Encounter for screening for HIV 03/22/2018  . Estrogen deficiency 03/22/2018  . Hydradenitis 05/14/2017  . Depression, major, recurrent (HCC) 02/25/2016  . Family history of  leukemia 03/18/2014  . Encounter for routine gynecological examination 09/20/2012  . Routine general medical examination at a health care facility 09/16/2012  . Other screening mammogram 02/02/2011  . Obesity (BMI 30-39.9) 12/30/2009  . PURE HYPERCHOLESTEROLEMIA 03/03/2009  . Osteoporosis 05/20/2008   Past Medical History:  Diagnosis Date  . Hyperlipidemia   . Osteopenia   . Sleep apnea    No past surgical history on file. Social History   Tobacco Use  . Smoking status: Never Smoker  . Smokeless tobacco: Never Used  Substance Use Topics  . Alcohol use: Yes    Alcohol/week: 0.0 standard drinks    Comment: rarely  . Drug use: No   Family History  Problem Relation Age of Onset  . Bladder Cancer Brother   . Breast cancer Neg Hx    No Known Allergies Current Outpatient Medications on File Prior to Visit  Medication Sig Dispense Refill  . sertraline (ZOLOFT) 100 MG tablet TAKE 1 TABLET BY MOUTH AT BEDTIME 90 tablet 0   No current facility-administered medications on file prior to visit.    Review of Systems  Constitutional: Negative for activity change, appetite change, fatigue, fever and unexpected weight change.  HENT: Negative for congestion, ear pain, rhinorrhea, sinus pressure and sore throat.   Eyes: Negative for pain, redness and visual disturbance.  Respiratory: Negative for cough, shortness of breath and wheezing.   Cardiovascular: Negative for chest pain and palpitations.  Gastrointestinal: Negative for abdominal pain, blood in stool, constipation and diarrhea.  Endocrine: Negative for polydipsia and polyuria.  Genitourinary: Negative for dysuria, frequency and urgency.  Musculoskeletal: Negative for arthralgias, back pain and myalgias.  Skin: Negative for pallor and rash.  Allergic/Immunologic: Negative for environmental allergies.  Neurological: Negative for dizziness, syncope and headaches.  Hematological: Negative for adenopathy. Does not bruise/bleed  easily.  Psychiatric/Behavioral: Negative for decreased concentration and dysphoric mood. The patient is not nervous/anxious.        Objective:   Physical Exam Constitutional:      General: She is not in acute distress.    Appearance: Normal appearance. She is well-developed. She is obese. She is not ill-appearing or diaphoretic.  HENT:     Head: Normocephalic and atraumatic.     Right Ear: Tympanic membrane, ear canal and external ear normal.     Left Ear: Tympanic membrane, ear canal and external ear normal.     Nose: Nose normal. No congestion.     Mouth/Throat:     Mouth: Mucous membranes are moist.     Pharynx: Oropharynx is clear. No posterior oropharyngeal erythema.  Eyes:     General: No scleral icterus.    Extraocular Movements: Extraocular movements intact.     Conjunctiva/sclera: Conjunctivae normal.     Pupils: Pupils are equal, round, and reactive to light.  Neck:  Thyroid: No thyromegaly.     Vascular: No carotid bruit or JVD.  Cardiovascular:     Rate and Rhythm: Normal rate and regular rhythm.     Pulses: Normal pulses.     Heart sounds: Normal heart sounds. No gallop.   Pulmonary:     Effort: Pulmonary effort is normal. No respiratory distress.     Breath sounds: Normal breath sounds. No wheezing.     Comments: Good air exch Chest:     Chest wall: No tenderness.  Abdominal:     General: Bowel sounds are normal. There is no distension or abdominal bruit.     Palpations: Abdomen is soft. There is no mass.     Tenderness: There is no abdominal tenderness.     Hernia: No hernia is present.  Genitourinary:    Comments: Breast exam: No mass, nodules, thickening, tenderness, bulging, retraction, inflamation, nipple discharge or skin changes noted.  No axillary or clavicular LA.     Musculoskeletal:        General: No tenderness. Normal range of motion.     Cervical back: Normal range of motion and neck supple. No rigidity. No muscular tenderness.     Right  lower leg: No edema.     Left lower leg: No edema.     Comments: No kyphosis or acute joint changes  Lymphadenopathy:     Cervical: No cervical adenopathy.  Skin:    General: Skin is warm and dry.     Coloration: Skin is not pale.     Findings: No erythema or rash.     Comments: Solar lentigines diffusely Few sks  Neurological:     Mental Status: She is alert. Mental status is at baseline.     Cranial Nerves: No cranial nerve deficit.     Motor: No abnormal muscle tone.     Coordination: Coordination normal.     Gait: Gait normal.     Deep Tendon Reflexes: Reflexes are normal and symmetric. Reflexes normal.  Psychiatric:        Mood and Affect: Mood normal.        Cognition and Memory: Cognition and memory normal.           Assessment & Plan:   Problem List Items Addressed This Visit      Musculoskeletal and Integument   Osteoporosis    Reviewed dexa 1/20 with worse bone density in FN and forearm  Remote hx of fx Not compliant with ca and D and not much exercise  Counseled on bone health-need for these Px alendronate to start weekly with directions and disc of poss side eff (will call)  If no problems will plan 5 y course No fx or falls recently ( disc fall prevention)       Relevant Medications   alendronate (FOSAMAX) 70 MG tablet     Other   PURE HYPERCHOLESTEROLEMIA    Due for labs Per pt diet has improved  No known atherosclerosis       Relevant Orders   Lipid panel (Completed)   Obesity (BMI 30-39.9)    Discussed how this problem influences overall health and the risks it imposes  Reviewed plan for weight loss with lower calorie diet (via better food choices and also portion control or program like weight watchers) and exercise building up to or more than 30 minutes 5 days per week including some aerobic activity         Depression, major, recurrent (HCC)  Pt continues to do well with sertraline 100 mg  Enc good self care including exercise        Welcome to Medicare preventive visit - Primary    Reviewed health habits including diet and exercise and skin cancer prevention Reviewed appropriate screening tests for age  Also reviewed health mt list, fam hx and immunization status , as well as social and family history   See HPI Labs ordered /drawn Called for colonoscopy report from 3/20  prevnar vaccine given today Pt had her covid vaccines  dexa reviewed and alendronate px for OP Strongly enc ca and D and exercise  Pt has advance directive No cognitive concerns Nl hearing screen and vision screen  Counseled re: need for wt control         Relevant Orders   CBC with Differential/Platelet (Completed)   Comprehensive metabolic panel (Completed)   Lipid panel (Completed)   TSH (Completed)   Pneumococcal conjugate vaccine 13-valent (Completed)    Other Visit Diagnoses    Need for vaccination with 13-polyvalent pneumococcal conjugate vaccine       Relevant Orders   Pneumococcal conjugate vaccine 13-valent (Completed)

## 2019-07-23 NOTE — Patient Instructions (Addendum)
If you are interested in the new shingles vaccine (Shingrix) - call your local pharmacy to check on coverage and availability  If affordable, get on a wait list at your pharmacy to get the vaccine.  Try to get 1200-1500 mg of calcium per day with at least 1000 iu of vitamin D - for bone health  Take D alone if you cannot take calcium   Let's start you on alendronate (fosamax)- let me know if any problems   I highly recommend investigate indoor/home  exercise when you cannot get outdoors   prevnar vaccine today

## 2019-07-23 NOTE — Assessment & Plan Note (Signed)
Reviewed health habits including diet and exercise and skin cancer prevention Reviewed appropriate screening tests for age  Also reviewed health mt list, fam hx and immunization status , as well as social and family history   See HPI Labs ordered /drawn Called for colonoscopy report from 3/20  prevnar vaccine given today Pt had her covid vaccines  dexa reviewed and alendronate px for OP Strongly enc ca and D and exercise  Pt has advance directive No cognitive concerns Nl hearing screen and vision screen  Counseled re: need for wt control

## 2019-07-23 NOTE — Assessment & Plan Note (Signed)
Reviewed dexa 1/20 with worse bone density in FN and forearm  Remote hx of fx Not compliant with ca and D and not much exercise  Counseled on bone health-need for these Px alendronate to start weekly with directions and disc of poss side eff (will call)  If no problems will plan 5 y course No fx or falls recently ( disc fall prevention)

## 2019-07-23 NOTE — Assessment & Plan Note (Signed)
Discussed how this problem influences overall health and the risks it imposes  Reviewed plan for weight loss with lower calorie diet (via better food choices and also portion control or program like weight watchers) and exercise building up to or more than 30 minutes 5 days per week including some aerobic activity    

## 2019-10-15 ENCOUNTER — Other Ambulatory Visit: Payer: Self-pay | Admitting: Family Medicine

## 2019-11-17 DIAGNOSIS — W1789XA Other fall from one level to another, initial encounter: Secondary | ICD-10-CM | POA: Diagnosis not present

## 2019-11-17 DIAGNOSIS — M545 Low back pain: Secondary | ICD-10-CM | POA: Diagnosis not present

## 2019-11-17 DIAGNOSIS — S3992XA Unspecified injury of lower back, initial encounter: Secondary | ICD-10-CM | POA: Diagnosis not present

## 2019-11-17 DIAGNOSIS — R58 Hemorrhage, not elsewhere classified: Secondary | ICD-10-CM | POA: Diagnosis not present

## 2019-11-17 DIAGNOSIS — S79912A Unspecified injury of left hip, initial encounter: Secondary | ICD-10-CM | POA: Diagnosis not present

## 2019-11-20 DIAGNOSIS — S79912A Unspecified injury of left hip, initial encounter: Secondary | ICD-10-CM | POA: Diagnosis not present

## 2019-11-28 DIAGNOSIS — M545 Low back pain: Secondary | ICD-10-CM | POA: Diagnosis not present

## 2019-11-28 DIAGNOSIS — M5416 Radiculopathy, lumbar region: Secondary | ICD-10-CM | POA: Diagnosis not present

## 2019-12-02 ENCOUNTER — Encounter: Payer: Self-pay | Admitting: Family Medicine

## 2019-12-05 ENCOUNTER — Other Ambulatory Visit: Payer: Self-pay | Admitting: Orthopedic Surgery

## 2019-12-05 DIAGNOSIS — M545 Low back pain, unspecified: Secondary | ICD-10-CM

## 2019-12-06 ENCOUNTER — Ambulatory Visit
Admission: RE | Admit: 2019-12-06 | Discharge: 2019-12-06 | Disposition: A | Discharge status: Home / Self Care | Payer: Medicare PPO | Source: Ambulatory Visit | Attending: Orthopedic Surgery | Admitting: Orthopedic Surgery

## 2019-12-06 DIAGNOSIS — M545 Low back pain, unspecified: Secondary | ICD-10-CM

## 2019-12-17 ENCOUNTER — Other Ambulatory Visit: Payer: Self-pay | Admitting: Physician Assistant

## 2019-12-17 DIAGNOSIS — R102 Pelvic and perineal pain: Secondary | ICD-10-CM

## 2019-12-19 ENCOUNTER — Ambulatory Visit
Admission: RE | Admit: 2019-12-19 | Discharge: 2019-12-19 | Disposition: A | Discharge status: Home / Self Care | Payer: Medicare PPO | Source: Ambulatory Visit | Attending: Physician Assistant | Admitting: Physician Assistant

## 2019-12-19 ENCOUNTER — Other Ambulatory Visit: Payer: Self-pay

## 2019-12-19 DIAGNOSIS — S32592A Other specified fracture of left pubis, initial encounter for closed fracture: Secondary | ICD-10-CM | POA: Diagnosis not present

## 2019-12-19 DIAGNOSIS — R102 Pelvic and perineal pain: Secondary | ICD-10-CM

## 2019-12-19 DIAGNOSIS — S3219XA Other fracture of sacrum, initial encounter for closed fracture: Secondary | ICD-10-CM | POA: Diagnosis not present

## 2019-12-19 DIAGNOSIS — S32412A Displaced fracture of anterior wall of left acetabulum, initial encounter for closed fracture: Secondary | ICD-10-CM | POA: Diagnosis not present

## 2019-12-19 DIAGNOSIS — K5730 Diverticulosis of large intestine without perforation or abscess without bleeding: Secondary | ICD-10-CM | POA: Diagnosis not present

## 2019-12-22 DIAGNOSIS — S329XXA Fracture of unspecified parts of lumbosacral spine and pelvis, initial encounter for closed fracture: Secondary | ICD-10-CM | POA: Diagnosis not present

## 2019-12-25 DIAGNOSIS — M545 Low back pain: Secondary | ICD-10-CM | POA: Diagnosis not present

## 2019-12-25 DIAGNOSIS — R2689 Other abnormalities of gait and mobility: Secondary | ICD-10-CM | POA: Diagnosis not present

## 2019-12-31 DIAGNOSIS — R2689 Other abnormalities of gait and mobility: Secondary | ICD-10-CM | POA: Diagnosis not present

## 2019-12-31 DIAGNOSIS — M545 Low back pain: Secondary | ICD-10-CM | POA: Diagnosis not present

## 2020-01-02 DIAGNOSIS — M545 Low back pain: Secondary | ICD-10-CM | POA: Diagnosis not present

## 2020-01-02 DIAGNOSIS — R2689 Other abnormalities of gait and mobility: Secondary | ICD-10-CM | POA: Diagnosis not present

## 2020-01-07 DIAGNOSIS — M545 Low back pain: Secondary | ICD-10-CM | POA: Diagnosis not present

## 2020-01-07 DIAGNOSIS — R2689 Other abnormalities of gait and mobility: Secondary | ICD-10-CM | POA: Diagnosis not present

## 2020-01-09 DIAGNOSIS — R2689 Other abnormalities of gait and mobility: Secondary | ICD-10-CM | POA: Diagnosis not present

## 2020-01-09 DIAGNOSIS — M545 Low back pain: Secondary | ICD-10-CM | POA: Diagnosis not present

## 2020-01-14 DIAGNOSIS — R2689 Other abnormalities of gait and mobility: Secondary | ICD-10-CM | POA: Diagnosis not present

## 2020-01-14 DIAGNOSIS — M545 Low back pain: Secondary | ICD-10-CM | POA: Diagnosis not present

## 2020-01-16 DIAGNOSIS — R2689 Other abnormalities of gait and mobility: Secondary | ICD-10-CM | POA: Diagnosis not present

## 2020-01-16 DIAGNOSIS — M545 Low back pain: Secondary | ICD-10-CM | POA: Diagnosis not present

## 2020-01-19 DIAGNOSIS — M545 Low back pain: Secondary | ICD-10-CM | POA: Diagnosis not present

## 2020-01-20 DIAGNOSIS — R2689 Other abnormalities of gait and mobility: Secondary | ICD-10-CM | POA: Diagnosis not present

## 2020-01-20 DIAGNOSIS — M545 Low back pain: Secondary | ICD-10-CM | POA: Diagnosis not present

## 2020-01-23 DIAGNOSIS — R2689 Other abnormalities of gait and mobility: Secondary | ICD-10-CM | POA: Diagnosis not present

## 2020-01-23 DIAGNOSIS — M545 Low back pain: Secondary | ICD-10-CM | POA: Diagnosis not present

## 2020-06-18 ENCOUNTER — Other Ambulatory Visit: Payer: Self-pay | Admitting: Family Medicine

## 2020-07-12 DIAGNOSIS — D2262 Melanocytic nevi of left upper limb, including shoulder: Secondary | ICD-10-CM | POA: Diagnosis not present

## 2020-07-12 DIAGNOSIS — D2271 Melanocytic nevi of right lower limb, including hip: Secondary | ICD-10-CM | POA: Diagnosis not present

## 2020-07-12 DIAGNOSIS — D2261 Melanocytic nevi of right upper limb, including shoulder: Secondary | ICD-10-CM | POA: Diagnosis not present

## 2020-07-12 DIAGNOSIS — L820 Inflamed seborrheic keratosis: Secondary | ICD-10-CM | POA: Diagnosis not present

## 2020-07-12 DIAGNOSIS — D2272 Melanocytic nevi of left lower limb, including hip: Secondary | ICD-10-CM | POA: Diagnosis not present

## 2020-07-12 DIAGNOSIS — D225 Melanocytic nevi of trunk: Secondary | ICD-10-CM | POA: Diagnosis not present

## 2020-07-12 DIAGNOSIS — R202 Paresthesia of skin: Secondary | ICD-10-CM | POA: Diagnosis not present

## 2020-07-12 DIAGNOSIS — L298 Other pruritus: Secondary | ICD-10-CM | POA: Diagnosis not present

## 2020-07-19 ENCOUNTER — Other Ambulatory Visit: Payer: Self-pay | Admitting: Family Medicine

## 2020-07-19 NOTE — Telephone Encounter (Signed)
Patient scheduled appointment on 09/21/20

## 2020-07-19 NOTE — Telephone Encounter (Signed)
Med refilled once  

## 2020-07-19 NOTE — Telephone Encounter (Signed)
Pharmacy requests refill on: Sertraline 100 mg   LAST REFILL: 10/15/2019 (Q-90, R-2) LAST OV: 07/23/2019 NEXT OV: Not Scheduled  PHARMACY: [facility]

## 2020-07-19 NOTE — Telephone Encounter (Signed)
Please schedule annual exam and refill until then

## 2020-09-06 ENCOUNTER — Other Ambulatory Visit: Payer: Self-pay | Admitting: Family Medicine

## 2020-09-06 ENCOUNTER — Other Ambulatory Visit: Payer: Self-pay

## 2020-09-06 DIAGNOSIS — Z1231 Encounter for screening mammogram for malignant neoplasm of breast: Secondary | ICD-10-CM

## 2020-09-06 MED ORDER — ALENDRONATE SODIUM 70 MG PO TABS
ORAL_TABLET | ORAL | 1 refills | Status: DC
  Filled 2020-09-06: qty 12, 84d supply, fill #0

## 2020-09-08 ENCOUNTER — Other Ambulatory Visit: Payer: Self-pay

## 2020-09-13 ENCOUNTER — Telehealth: Payer: Self-pay | Admitting: Family Medicine

## 2020-09-13 DIAGNOSIS — M810 Age-related osteoporosis without current pathological fracture: Secondary | ICD-10-CM

## 2020-09-13 DIAGNOSIS — E7800 Pure hypercholesterolemia, unspecified: Secondary | ICD-10-CM

## 2020-09-13 DIAGNOSIS — Z0000 Encounter for general adult medical examination without abnormal findings: Secondary | ICD-10-CM

## 2020-09-13 NOTE — Telephone Encounter (Signed)
-----   Message from Terri J Walsh sent at 08/30/2020 11:35 AM EDT ----- Regarding: Lab orders for Tuesday, 5.10.22 Patient is scheduled for CPX labs, please order future labs, Thanks , Terri   

## 2020-09-14 ENCOUNTER — Other Ambulatory Visit: Payer: Self-pay

## 2020-09-14 ENCOUNTER — Other Ambulatory Visit: Payer: Medicare PPO

## 2020-09-14 ENCOUNTER — Ambulatory Visit (INDEPENDENT_AMBULATORY_CARE_PROVIDER_SITE_OTHER): Payer: Medicare PPO

## 2020-09-14 DIAGNOSIS — Z0000 Encounter for general adult medical examination without abnormal findings: Secondary | ICD-10-CM

## 2020-09-14 NOTE — Patient Instructions (Signed)
Emma Knight , Thank you for taking time to come for your Medicare Wellness Visit. I appreciate your ongoing commitment to your health goals. Please review the following plan we discussed and let me know if I can assist you in the future.   Screening recommendations/referrals: Colonoscopy: due, will discuss with provider at physical  Mammogram: due, will discuss with provider at physical  Bone Density: due, will discuss with provider at physical  Recommended yearly ophthalmology/optometry visit for glaucoma screening and checkup Recommended yearly dental visit for hygiene and checkup  Vaccinations: Influenza vaccine: due Fall 2022 Pneumococcal vaccine: due, will get at upcoming physical Tdap vaccine: Up to date, completed 03/18/2014, due 03/2024 Shingles vaccine: due, check with your insurance regarding coverage if interested    Covid-19:Completed series  Advanced directives: Please bring a copy of your POA (Power of [place]) and/or Living Will to your next appointment.   Conditions/risks identified: hypercholesterolemia   Next appointment: Follow up in one year for your annual wellness visit    Preventive Care 65 Years and Older, Female Preventive care refers to lifestyle choices and visits with your health care provider that can promote health and wellness. What does preventive care include?  A yearly physical exam. This is also called an annual well check.  Dental exams once or twice a year.  Routine eye exams. Ask your health care provider how often you should have your eyes checked.  Personal lifestyle choices, including:  Daily care of your teeth and gums.  Regular physical activity.  Eating a healthy diet.  Avoiding tobacco and drug use.  Limiting alcohol use.  Practicing safe sex.  Taking low-dose aspirin every day.  Taking vitamin and mineral supplements as recommended by your health care provider. What happens during an annual well check? The services and  screenings done by your health care provider during your annual well check will depend on your age, overall health, lifestyle risk factors, and family history of disease. Counseling  Your health care provider may ask you questions about your:  Alcohol use.  Tobacco use.  Drug use.  Emotional well-being.  Home and relationship well-being.  Sexual activity.  Eating habits.  History of falls.  Memory and ability to understand (cognition).  Work and work Astronomer.  Reproductive health. Screening  You may have the following tests or measurements:  Height, weight, and BMI.  Blood pressure.  Lipid and cholesterol levels. These may be checked every 5 years, or more frequently if you are over 43 years old.  Skin check.  Lung cancer screening. You may have this screening every year starting at age 72 if you have a 30-pack-year history of smoking and currently smoke or have quit within the past 15 years.  Fecal occult blood test (FOBT) of the stool. You may have this test every year starting at age 55.  Flexible sigmoidoscopy or colonoscopy. You may have a sigmoidoscopy every 5 years or a colonoscopy every 10 years starting at age 59.  Hepatitis C blood test.  Hepatitis B blood test.  Sexually transmitted disease (STD) testing.  Diabetes screening. This is done by checking your blood sugar (glucose) after you have not eaten for a while (fasting). You may have this done every 1-3 years.  Bone density scan. This is done to screen for osteoporosis. You may have this done starting at age 63.  Mammogram. This may be done every 1-2 years. Talk to your health care provider about how often you should have regular mammograms. Talk with  your health care provider about your test results, treatment options, and if necessary, the need for more tests. Vaccines  Your health care provider may recommend certain vaccines, such as:  Influenza vaccine. This is recommended every  year.  Tetanus, diphtheria, and acellular pertussis (Tdap, Td) vaccine. You may need a Td booster every 10 years.  Zoster vaccine. You may need this after age 4.  Pneumococcal 13-valent conjugate (PCV13) vaccine. One dose is recommended after age 94.  Pneumococcal polysaccharide (PPSV23) vaccine. One dose is recommended after age 1. Talk to your health care provider about which screenings and vaccines you need and how often you need them. This information is not intended to replace advice given to you by your health care provider. Make sure you discuss any questions you have with your health care provider. Document Released: 05/21/2015 Document Revised: 01/12/2016 Document Reviewed: 02/23/2015 Elsevier Interactive Patient Education  2017 [place] Prevention in the Home Falls can cause injuries. They can happen to people of all ages. There are many things you can do to make your home safe and to help prevent falls. What can I do on the outside of my home?  Regularly fix the edges of walkways and driveways and fix any cracks.  Remove anything that might make you trip as you walk through a door, such as a raised step or threshold.  Trim any bushes or trees on the path to your home.  Use bright outdoor lighting.  Clear any walking paths of anything that might make someone trip, such as rocks or tools.  Regularly check to see if handrails are loose or broken. Make sure that both sides of any steps have handrails.  Any raised decks and porches should have guardrails on the edges.  Have any leaves, snow, or ice cleared regularly.  Use sand or salt on walking paths during winter.  Clean up any spills in your garage right away. This includes oil or grease spills. What can I do in the bathroom?  Use night lights.  Install grab bars by the toilet and in the tub and shower. Do not use towel bars as grab bars.  Use non-skid mats or decals in the tub or shower.  If you  need to sit down in the shower, use a plastic, non-slip stool.  Keep the floor dry. Clean up any water that spills on the floor as soon as it happens.  Remove soap buildup in the tub or shower regularly.  Attach bath mats securely with double-sided non-slip rug tape.  Do not have throw rugs and other things on the floor that can make you trip. What can I do in the bedroom?  Use night lights.  Make sure that you have a light by your bed that is easy to reach.  Do not use any sheets or blankets that are too big for your bed. They should not hang down onto the floor.  Have a firm chair that has side arms. You can use this for support while you get dressed.  Do not have throw rugs and other things on the floor that can make you trip. What can I do in the kitchen?  Clean up any spills right away.  Avoid walking on wet floors.  Keep items that you use a lot in easy-to-reach places.  If you need to reach something above you, use a strong step stool that has a grab bar.  Keep electrical cords out of the way.  Do not  use floor polish or wax that makes floors slippery. If you must use wax, use non-skid floor wax.  Do not have throw rugs and other things on the floor that can make you trip. What can I do with my stairs?  Do not leave any items on the stairs.  Make sure that there are handrails on both sides of the stairs and use them. Fix handrails that are broken or loose. Make sure that handrails are as long as the stairways.  Check any carpeting to make sure that it is firmly attached to the stairs. Fix any carpet that is loose or worn.  Avoid having throw rugs at the top or bottom of the stairs. If you do have throw rugs, attach them to the floor with carpet tape.  Make sure that you have a light switch at the top of the stairs and the bottom of the stairs. If you do not have them, ask someone to add them for you. What else can I do to help prevent falls?  Wear shoes  that:  Do not have high heels.  Have rubber bottoms.  Are comfortable and fit you well.  Are closed at the toe. Do not wear sandals.  If you use a stepladder:  Make sure that it is fully opened. Do not climb a closed stepladder.  Make sure that both sides of the stepladder are locked into place.  Ask someone to hold it for you, if possible.  Clearly mark and make sure that you can see:  Any grab bars or handrails.  First and last steps.  Where the edge of each step is.  Use tools that help you move around (mobility aids) if they are needed. These include:  Canes.  Walkers.  Scooters.  Crutches.  Turn on the lights when you go into a dark area. Replace any light bulbs as soon as they burn out.  Set up your furniture so you have a clear path. Avoid moving your furniture around.  If any of your floors are uneven, fix them.  If there are any pets around you, be aware of where they are.  Review your medicines with your doctor. Some medicines can make you feel dizzy. This can increase your chance of falling. Ask your doctor what other things that you can do to help prevent falls. This information is not intended to replace advice given to you by your health care provider. Make sure you discuss any questions you have with your health care provider. Document Released: 02/18/2009 Document Revised: 09/30/2015 Document Reviewed: 05/29/2014 Elsevier Interactive Patient Education  2017 [employer].

## 2020-09-14 NOTE — Progress Notes (Signed)
Subjective:   Emma Knight is a 65 y.o. female who presents for Medicare Annual (Subsequent) preventive examination.  Review of Systems: N/A      I connected with the patient today by telephone and verified that I am speaking with the correct person using two identifiers. Location patient: home Location nurse: work Persons participating in the telephone visit: patient, nurse.   I discussed the limitations, risks, security and privacy concerns of performing an evaluation and management service by telephone and the availability of in person appointments. I also discussed with the patient that there may be a patient responsible charge related to this service. The patient expressed understanding and verbally consented to this telephonic visit.        Cardiac Risk Factors include: advanced age (>42men, >34 women);Other (see comment), Risk factor comments: hypercholesterolemia     Objective:    Today's Vitals   There is no height or weight on file to calculate BMI.  Advanced Directives 09/14/2020  Does Patient Have a Medical Advance Directive? Yes  Type of Estate agent of [place];Living will  Copy of Healthcare Power of Attorney in Chart? No - copy requested    Current Medications (verified) Outpatient Encounter Medications as of 09/14/2020  Medication Sig  . alendronate (FOSAMAX) 70 MG tablet TAKE 1 TABLET BY MOUTH EVERY 7 DAYS. TAKE WITH A FULL GLASS OF WATER ON AN EMPTY STOMACH.  . sertraline (ZOLOFT) 100 MG tablet TAKE 1 TABLET BY MOUTH AT BEDTIME  . hydrocortisone (ANUSOL-HC) 2.5 % rectal cream Place 1 application rectally 2 (two) times daily as needed for hemorrhoids or anal itching.   No facility-administered encounter medications on file as of 09/14/2020.    Allergies (verified) Patient has no known allergies.   History: Past Medical History:  Diagnosis Date  . Hyperlipidemia   . Osteopenia   . Sleep apnea    History reviewed. No  pertinent surgical history. Family History  Problem Relation Age of Onset  . Bladder Cancer Brother   . Breast cancer Neg Hx    Social History   Socioeconomic History  . Marital status: Divorced    Spouse name: Not on file  . Number of children: Not on file  . Years of education: Not on file  . Highest education level: Not on file  Occupational History  . Not on file  Tobacco Use  . Smoking status: Never Smoker  . Smokeless tobacco: Never Used  Substance and Sexual Activity  . Alcohol use: Yes    Alcohol/week: 0.0 standard drinks    Comment: rarely  . Drug use: No  . Sexual activity: Not on file  Other Topics Concern  . Not on file  Social History Narrative  . Not on file   Social Determinants of Health   Financial Resource Strain: Low Risk   . Difficulty of Paying Living Expenses: Not hard at all  Food Insecurity: No Food Insecurity  . Worried About Programme researcher, broadcasting/film/video in the Last Year: Never true  . Ran Out of Food in the Last Year: Never true  Transportation Needs: No Transportation Needs  . Lack of Transportation (Medical): No  . Lack of Transportation (Non-Medical): No  Physical Activity: Inactive  . Days of Exercise per Week: 0 days  . Minutes of Exercise per Session: 0 min  Stress: No Stress Concern Present  . Feeling of Stress : Not at all  Social Connections: Not on file    Tobacco Counseling Counseling given:  Not Answered   Clinical Intake:  Pre-visit preparation completed: Yes  Pain : No/denies pain     Nutritional Risks: None Diabetes: No  How often do you need to have someone help you when you read instructions, pamphlets, or other written materials from your doctor or pharmacy?: 1 - Never What is the last grade level you completed in school?: college  Diabetic: No Nutrition Risk Assessment:  Has the patient had any N/V/D within the last 2 months?  No  Does the patient have any non-healing wounds?  No  Has the patient had any  unintentional weight loss or weight gain?  No   Diabetes:  Is the patient diabetic?  No  If diabetic, was a CBG obtained today?  N/A Did the patient bring in their glucometer from home?  N/A How often do you monitor your CBG's? N/A.   Financial Strains and Diabetes Management:  Are you having any financial strains with the device, your supplies or your medication? N/A.  Does the patient want to be seen by Chronic Care Management for management of their diabetes?  N/A Would the patient like to be referred to a Nutritionist or for Diabetic Management?  N/A     Interpreter Needed?: No  Information entered by :: CJohnson, LPN   Activities of Daily Living In your present state of health, do you have any difficulty performing the following activities: 09/14/2020  Hearing? N  Vision? N  Difficulty concentrating or making decisions? N  Walking or climbing stairs? N  Dressing or bathing? N  Doing errands, shopping? N  Preparing Food and eating ? N  Using the Toilet? N  In the past six months, have you accidently leaked urine? N  Do you have problems with loss of bowel control? N  Managing your Medications? N  Managing your Finances? N  Housekeeping or managing your Housekeeping? N  Some recent data might be hidden    Patient Care Team: Tower, Audrie Gallus, MD as PCP - General  Indicate any recent Medical Services you may have received from other than [facility] providers in the past year (date may be approximate).     Assessment:   This is a routine wellness examination for Emma Knight.  Hearing/Vision screen  Hearing Screening   125Hz  250Hz  500Hz  1000Hz  2000Hz  3000Hz  4000Hz  6000Hz  8000Hz   Right ear:           Left ear:           Vision Screening Comments: Patient gets annual eye exams   Dietary issues and exercise activities discussed: Current Exercise Habits: The patient does not participate in regular exercise at present, Exercise limited by: None identified  Goals Addressed             This Visit's Progress   . Patient Stated       09/14/2020, I will maintain and continue medications as prescribed.      Depression Screen PHQ 2/9 Scores 09/14/2020 07/23/2019 03/22/2018 05/14/2017 03/23/2016 02/25/2016 09/20/2012  PHQ - 2 Score 0 0 0 0 4 3 0  PHQ- 9 Score 0 - 0 0 7 7 -    Fall Risk Fall Risk  09/14/2020 07/23/2019  Falls in the past year? 0 0  Number falls in past yr: 0 0  Injury with Fall? 0 -  Risk for fall due to : No Fall Risks -  Follow up Falls evaluation completed;Falls prevention discussed Falls evaluation completed    FALL RISK PREVENTION PERTAINING TO THE HOME:  Any stairs in or around the home? Yes  If so, are there any without handrails? No  Home free of loose throw rugs in walkways, pet beds, electrical cords, etc? Yes  Adequate lighting in your home to reduce risk of falls? Yes   ASSISTIVE DEVICES UTILIZED TO PREVENT FALLS:  Life alert? No  Use of a cane, walker or w/c? No  Grab bars in the bathroom? No  Shower chair or bench in shower? No  Elevated toilet seat or a handicapped toilet? No   TIMED UP AND GO:  Was the test performed? N/A telephone visit.  Cognitive Function: MMSE - Mini Mental State Exam 09/14/2020  Not completed: Refused       Mini Cog  Mini-Cog screen was not completed. Wanted to skip this. Maximum score is 22. A value of 0 denotes this part of the MMSE was not completed or the patient failed this part of the Mini-Cog screening.  Immunizations Immunization History  Administered Date(s) Administered  . Influenza Inj Mdck Quad With Preservative 02/23/2018  . Influenza Whole 01/25/2009  . Influenza,inj,Quad PF,6+ Mos 03/06/2014, 02/25/2016  . Influenza-Unspecified 01/21/2015, 02/04/2017, 01/21/2019  . PFIZER(Purple Top)SARS-COV-2 Vaccination 05/20/2019, 06/10/2019, 02/27/2020  . Pneumococcal Conjugate-13 07/23/2019  . Td 05/09/2003  . Tdap 03/18/2014    TDAP status: Up to date  Flu Vaccine status: Up to  date  Pneumococcal vaccine status: Due, Education has been provided regarding the importance of this vaccine. Advised may receive this vaccine at local pharmacy or Health Dept. Aware to provide a copy of the vaccination record if obtained from local pharmacy or Health Dept. Verbalized acceptance and understanding.  Covid-19 vaccine status: Completed vaccines  Qualifies for Shingles Vaccine? Yes   Zostavax completed No   Shingrix Completed?: No.    Education has been provided regarding the importance of this vaccine. Patient has been advised to call insurance company to determine out of pocket expense if they have not yet received this vaccine. Advised may also receive vaccine at local pharmacy or Health Dept. Verbalized acceptance and understanding.  Screening Tests Health Maintenance  Topic Date Due  . COLONOSCOPY (Pts 45-71yrs Insurance coverage will need to be confirmed)  12/15/2017  . MAMMOGRAM  11/06/2019  . DEXA SCAN  05/15/2020  . PNA vac Low Risk Adult (2 of 2 - PPSV23) 07/22/2020  . INFLUENZA VACCINE  12/06/2020  . TETANUS/TDAP  03/18/2024  . COVID-19 Vaccine  Completed  . Hepatitis C Screening  Completed  . HPV VACCINES  Aged Out    Health Maintenance  Health Maintenance Due  Topic Date Due  . COLONOSCOPY (Pts 45-73yrs Insurance coverage will need to be confirmed)  12/15/2017  . MAMMOGRAM  11/06/2019  . DEXA SCAN  05/15/2020  . PNA vac Low Risk Adult (2 of 2 - PPSV23) 07/22/2020    Colorectal cancer screening:due, will discuss with provider at physical   Mammogram status:due, will discuss with provider at physical   Bone Density status:due, will discuss with provider at physical   Lung Cancer Screening: (Low Dose CT Chest recommended if Age 81-80 years, 30 pack-year currently smoking OR have quit w/in 15years.) does not qualify.   Additional Screening:  Hepatitis C Screening: does qualify; Completed 03/22/2018  Vision Screening: Recommended annual ophthalmology  exams for early detection of glaucoma and other disorders of the eye. Is the patient up to date with their annual eye exam?  Yes  Who is the provider or what is the name of the office in which  the patient attends annual eye exams? In the process of finding a new eye doctor If pt is not established with a provider, would they like to be referred to a provider to establish care? No .   Dental Screening: Recommended annual dental exams for proper oral hygiene  Community Resource Referral / Chronic Care Management: CRR required this visit?  No   CCM required this visit?  No      Plan:     I have personally reviewed and noted the following in the patient's chart:   . Medical and social history . Use of alcohol, tobacco or illicit drugs  . Current medications and supplements including opioid prescriptions.  . Functional ability and status . Nutritional status . Physical activity . Advanced directives . List of other physicians . Hospitalizations, surgeries, and ER visits in previous 12 months . Vitals . Screenings to include cognitive, depression, and falls . Referrals and appointments  In addition, I have reviewed and discussed with patient certain preventive protocols, quality metrics, and best practice recommendations. A written personalized care plan for preventive services as well as general preventive health recommendations were provided to patient.   Due to this being a telephonic visit, the after visit summary with patients personalized plan was offered to patient via office or my-chart. Patient preferred to pick up at office at next visit or via mychart.   Janalyn ShyJohnson, Herald Vallin, LPN   1/61/09605/02/2021

## 2020-09-14 NOTE — Progress Notes (Signed)
PCP notes:  Health Maintenance: Pneumococcal 23- due  Colonoscopy- due Mammogram- due Dexa- due    Abnormal Screenings: none   Patient concerns: none   Nurse concerns: none   Next PCP appt.: 09/21/2020 @ 11:30 am

## 2020-09-15 ENCOUNTER — Other Ambulatory Visit (INDEPENDENT_AMBULATORY_CARE_PROVIDER_SITE_OTHER): Payer: Medicare PPO

## 2020-09-15 ENCOUNTER — Other Ambulatory Visit: Payer: Self-pay

## 2020-09-15 DIAGNOSIS — E7800 Pure hypercholesterolemia, unspecified: Secondary | ICD-10-CM | POA: Diagnosis not present

## 2020-09-15 DIAGNOSIS — Z0000 Encounter for general adult medical examination without abnormal findings: Secondary | ICD-10-CM | POA: Diagnosis not present

## 2020-09-15 DIAGNOSIS — M810 Age-related osteoporosis without current pathological fracture: Secondary | ICD-10-CM

## 2020-09-15 LAB — CBC WITH DIFFERENTIAL/PLATELET
Basophils Absolute: 0 10*3/uL (ref 0.0–0.1)
Basophils Relative: 0.3 % (ref 0.0–3.0)
Eosinophils Absolute: 0 10*3/uL (ref 0.0–0.7)
Eosinophils Relative: 0.9 % (ref 0.0–5.0)
HCT: 40.2 % (ref 36.0–46.0)
Hemoglobin: 13.7 g/dL (ref 12.0–15.0)
Lymphocytes Relative: 40.4 % (ref 12.0–46.0)
Lymphs Abs: 1.8 10*3/uL (ref 0.7–4.0)
MCHC: 34.2 g/dL (ref 30.0–36.0)
MCV: 92.2 fl (ref 78.0–100.0)
Monocytes Absolute: 0.4 10*3/uL (ref 0.1–1.0)
Monocytes Relative: 10.2 % (ref 3.0–12.0)
Neutro Abs: 2.1 10*3/uL (ref 1.4–7.7)
Neutrophils Relative %: 48.2 % (ref 43.0–77.0)
Platelets: 226 10*3/uL (ref 150.0–400.0)
RBC: 4.36 Mil/uL (ref 3.87–5.11)
RDW: 13.9 % (ref 11.5–15.5)
WBC: 4.4 10*3/uL (ref 4.0–10.5)

## 2020-09-15 LAB — COMPREHENSIVE METABOLIC PANEL
ALT: 19 U/L (ref 0–35)
AST: 21 U/L (ref 0–37)
Albumin: 4.2 g/dL (ref 3.5–5.2)
Alkaline Phosphatase: 71 U/L (ref 39–117)
BUN: 19 mg/dL (ref 6–23)
CO2: 30 mEq/L (ref 19–32)
Calcium: 9.3 mg/dL (ref 8.4–10.5)
Chloride: 103 mEq/L (ref 96–112)
Creatinine, Ser: 0.56 mg/dL (ref 0.40–1.20)
GFR: 95.23 mL/min (ref >60.00)
Glucose, Bld: 119 mg/dL — ABNORMAL HIGH (ref 70–99)
Potassium: 4.6 mEq/L (ref 3.5–5.1)
Sodium: 139 mEq/L (ref 135–145)
Total Bilirubin: 0.3 mg/dL (ref 0.2–1.2)
Total Protein: 6.5 g/dL (ref 6.0–8.3)

## 2020-09-15 LAB — LIPID PANEL
Cholesterol: 259 mg/dL — ABNORMAL HIGH (ref 0–200)
HDL: 80.9 mg/dL (ref >39.00)
LDL Cholesterol: 166 mg/dL — ABNORMAL HIGH (ref 0–99)
NonHDL: 177.77
Total CHOL/HDL Ratio: 3
Triglycerides: 57 mg/dL (ref 0.0–149.0)
VLDL: 11.4 mg/dL (ref 0.0–40.0)

## 2020-09-15 LAB — VITAMIN D 25 HYDROXY (VIT D DEFICIENCY, FRACTURES): VITD: 27.96 ng/mL — ABNORMAL LOW (ref 30.00–100.00)

## 2020-09-15 LAB — TSH: TSH: 2.42 u[IU]/mL (ref 0.35–4.50)

## 2020-09-21 ENCOUNTER — Other Ambulatory Visit: Payer: Self-pay

## 2020-09-21 ENCOUNTER — Encounter: Payer: Self-pay | Admitting: Family Medicine

## 2020-09-21 ENCOUNTER — Ambulatory Visit (INDEPENDENT_AMBULATORY_CARE_PROVIDER_SITE_OTHER): Payer: Medicare PPO | Admitting: Family Medicine

## 2020-09-21 VITALS — BP 110/78 | HR 65 | Temp 98.9°F | Ht 64.25 in | Wt 213.5 lb

## 2020-09-21 DIAGNOSIS — Z23 Encounter for immunization: Secondary | ICD-10-CM | POA: Diagnosis not present

## 2020-09-21 DIAGNOSIS — E2839 Other primary ovarian failure: Secondary | ICD-10-CM | POA: Diagnosis not present

## 2020-09-21 DIAGNOSIS — F330 Major depressive disorder, recurrent, mild: Secondary | ICD-10-CM | POA: Diagnosis not present

## 2020-09-21 DIAGNOSIS — M810 Age-related osteoporosis without current pathological fracture: Secondary | ICD-10-CM | POA: Diagnosis not present

## 2020-09-21 DIAGNOSIS — E7800 Pure hypercholesterolemia, unspecified: Secondary | ICD-10-CM

## 2020-09-21 DIAGNOSIS — R7309 Other abnormal glucose: Secondary | ICD-10-CM

## 2020-09-21 DIAGNOSIS — Z0000 Encounter for general adult medical examination without abnormal findings: Secondary | ICD-10-CM | POA: Diagnosis not present

## 2020-09-21 DIAGNOSIS — E6609 Other obesity due to excess calories: Secondary | ICD-10-CM

## 2020-09-21 DIAGNOSIS — Z6836 Body mass index (BMI) 36.0-36.9, adult: Secondary | ICD-10-CM

## 2020-09-21 DIAGNOSIS — Z1211 Encounter for screening for malignant neoplasm of colon: Secondary | ICD-10-CM | POA: Diagnosis not present

## 2020-09-21 DIAGNOSIS — I5021 Acute systolic (congestive) heart failure: Secondary | ICD-10-CM | POA: Insufficient documentation

## 2020-09-21 MED ORDER — SERTRALINE HCL 100 MG PO TABS
ORAL_TABLET | Freq: Every day | ORAL | 3 refills | Status: DC
  Filled 2020-10-12: qty 90, 90d supply, fill #0
  Filled 2021-01-17: qty 90, 90d supply, fill #1
  Filled 2021-04-18: qty 90, 90d supply, fill #2
  Filled 2021-07-14: qty 90, 90d supply, fill #3

## 2020-09-21 MED ORDER — ALENDRONATE SODIUM 70 MG PO TABS
ORAL_TABLET | ORAL | 3 refills | Status: DC
  Filled 2020-12-27: qty 12, 84d supply, fill #0
  Filled 2021-03-14: qty 12, 84d supply, fill #1
  Filled 2021-08-09: qty 12, 84d supply, fill #2

## 2020-09-21 NOTE — Assessment & Plan Note (Signed)
Referral done for screening colonoscopy  

## 2020-09-21 NOTE — Progress Notes (Signed)
Subjective:    Patient ID: Emma PolesJanet K Herberger, female    DOB: 1955-11-18, 65 y.o.   MRN: [id number]  This visit occurred during the SARS-CoV-2 public health emergency.  Safety protocols were in place, including screening questions prior to the visit, additional usage of staff PPE, and extensive cleaning of exam room while observing appropriate contact time as indicated for disinfecting solutions.    HPI Here for health maintenance exam and to review chronic medical problems    Wt Readings from Last 3 Encounters:  09/21/20 213 lb 8 oz (96.8 kg)  07/23/19 193 lb 1 oz (87.6 kg)  07/10/18 179 lb 3 oz (81.3 kg)   36.36 kg/m   Feeling ok overall  Not doing a lot lately  Gained weight due to her pelvis fx last summer-now getting back to regular movement  Getting back to a better diet /eating (was hard to stay motivated during the pandemic)  No more pain from pelvis   amw was 5/10   Colonoscopy 8/09-wants to schedule   Mammogram 1/20, scheduled for tomorrow at [facility] Self breast exam- no lumps   dexa 1/20 -OP-wants to schedule  On alendronate for a year Falls -one getting off a boat (slipped cleaning)  Fractures -pelvic fx after fall  Supplements  D level in 20s - not taking regularly  Exercise - just getting started walking   prevnar 3/21 Due for pna 23   Flu shot-no  covid immunized Zoster status -interested in shingrix later if covered   Tdap 11/15   Mood/history of depression  Sertraline 100 mg daily   BP Readings from Last 3 Encounters:  09/21/20 110/78  07/23/19 112/74  07/10/18 124/72   Pulse Readings from Last 3 Encounters:  09/21/20 65  07/23/19 68  07/10/18 64   Hyperlipidemia Lab Results  Component Value Date   CHOL 259 (H) 09/15/2020   CHOL 243 (H) 07/23/2019   CHOL 225 (H) 03/22/2018   Lab Results  Component Value Date   HDL 80.90 09/15/2020   HDL 85.20 07/23/2019   HDL 93.10 03/22/2018   Lab Results  Component Value Date   LDLCALC 166  (H) 09/15/2020   LDLCALC 149 (H) 07/23/2019   LDLCALC 121 (H) 03/22/2018   Lab Results  Component Value Date   TRIG 57.0 09/15/2020   TRIG 47.0 07/23/2019   TRIG 54.0 03/22/2018   Lab Results  Component Value Date   CHOLHDL 3 09/15/2020   CHOLHDL 3 07/23/2019   CHOLHDL 2 03/22/2018   Lab Results  Component Value Date   LDLDIRECT 130.7 08/05/2010   LDLDIRECT 157.7 06/14/2010   LDLDIRECT 153.9 03/08/2010   Diet controlled  Not eating right until this week  Now eating chiicken and some shrimp  Some butter and cheese   The 10-year ASCVD risk score Denman George(Goff DC Jr., et al., 2013) is: 4.6%   Values used to calculate the score:     Age: [age] years     Sex: Female     Is Non-Hispanic African American: No     Diabetic: No     Tobacco smoker: No     Systolic Blood Pressure: 110 mmHg     Is BP treated: No     HDL Cholesterol: 80.9 mg/dL     Total Cholesterol: 259 mg/dL   Other labs Results for orders placed or performed in visit on 09/15/20  VITAMIN D 25 Hydroxy (Vit-D Deficiency, Fractures)  Result Value Ref Range   VITD 27.96 (L) 30.00 -  100.00 ng/mL  TSH  Result Value Ref Range   TSH 2.42 0.35 - 4.50 uIU/mL  Lipid panel  Result Value Ref Range   Cholesterol 259 (H) 0 - 200 mg/dL   Triglycerides 30.0 0.0 - 149.0 mg/dL   HDL 76.22 >63.33 mg/dL   VLDL 54.5 0.0 - 62.5 mg/dL   LDL Cholesterol 638 (H) 0 - 99 mg/dL   Total CHOL/HDL Ratio 3    NonHDL 177.77   Comprehensive metabolic panel  Result Value Ref Range   Sodium 139 135 - 145 mEq/L   Potassium 4.6 3.5 - 5.1 mEq/L   Chloride 103 96 - 112 mEq/L   CO2 30 19 - 32 mEq/L   Glucose, Bld 119 (H) 70 - 99 mg/dL   BUN 19 6 - 23 mg/dL   Creatinine, Ser 9.37 0.40 - 1.20 mg/dL   Total Bilirubin 0.3 0.2 - 1.2 mg/dL   Alkaline Phosphatase 71 39 - 117 U/L   AST 21 0 - 37 U/L   ALT 19 0 - 35 U/L   Total Protein 6.5 6.0 - 8.3 g/dL   Albumin 4.2 3.5 - 5.2 g/dL   GFR 34.28 >76.81 mL/min   Calcium 9.3 8.4 - 10.5 mg/dL  CBC with  Differential/Platelet  Result Value Ref Range   WBC 4.4 4.0 - 10.5 K/uL   RBC 4.36 3.87 - 5.11 Mil/uL   Hemoglobin 13.7 12.0 - 15.0 g/dL   HCT 15.7 26.2 - 03.5 %   MCV 92.2 78.0 - 100.0 fl   MCHC 34.2 30.0 - 36.0 g/dL   RDW 59.7 41.6 - 38.4 %   Platelets 226.0 150.0 - 400.0 K/uL   Neutrophils Relative % 48.2 43.0 - 77.0 %   Lymphocytes Relative 40.4 12.0 - 46.0 %   Monocytes Relative 10.2 3.0 - 12.0 %   Eosinophils Relative 0.9 0.0 - 5.0 %   Basophils Relative 0.3 0.0 - 3.0 %   Neutro Abs 2.1 1.4 - 7.7 K/uL   Lymphs Abs 1.8 0.7 - 4.0 K/uL   Monocytes Absolute 0.4 0.1 - 1.0 K/uL   Eosinophils Absolute 0.0 0.0 - 0.7 K/uL   Basophils Absolute 0.0 0.0 - 0.1 K/uL    Glucose of 119  Had sugar in coffee   Patient Active Problem List   Diagnosis Date Noted  . Class 2 obesity due to excess calories with body mass index (BMI) of 36.0 to 36.9 in adult 09/21/2020  . Colon cancer screening 09/21/2020  . Elevated glucose 09/21/2020  . Welcome to Medicare preventive visit 07/23/2019  . Thoracic back pain 07/10/2018  . Encounter for hepatitis C screening test for low risk patient 03/22/2018  . Encounter for screening for HIV 03/22/2018  . Estrogen deficiency 03/22/2018  . Hydradenitis 05/14/2017  . Depression, major, recurrent (HCC) 02/25/2016  . Family history of leukemia 03/18/2014  . Encounter for routine gynecological examination 09/20/2012  . Routine general medical examination at a health care facility 09/16/2012  . Other screening mammogram 02/02/2011  . PURE HYPERCHOLESTEROLEMIA 03/03/2009  . Osteoporosis 05/20/2008   Past Medical History:  Diagnosis Date  . Hyperlipidemia   . Osteopenia   . Sleep apnea    History reviewed. No pertinent surgical history. Social History   Tobacco Use  . Smoking status: Never Smoker  . Smokeless tobacco: Never Used  Substance Use Topics  . Alcohol use: Yes    Alcohol/week: 0.0 standard drinks    Comment: rarely  . Drug use: No    Family  History  Problem Relation Age of Onset  . Bladder Cancer Brother   . Breast cancer Neg Hx    No Known Allergies No current outpatient medications on file prior to visit.   No current facility-administered medications on file prior to visit.    Review of Systems  Constitutional: Negative for activity change, appetite change, fatigue, fever and unexpected weight change.  HENT: Negative for congestion, ear pain, rhinorrhea, sinus pressure and sore throat.   Eyes: Negative for pain, redness and visual disturbance.  Respiratory: Negative for cough, shortness of breath and wheezing.   Cardiovascular: Negative for chest pain and palpitations.  Gastrointestinal: Negative for abdominal pain, blood in stool, constipation and diarrhea.  Endocrine: Negative for polydipsia and polyuria.  Genitourinary: Negative for dysuria, frequency and urgency.  Musculoskeletal: Positive for arthralgias. Negative for back pain and myalgias.       R outer hip and leg pain recently  Skin: Negative for pallor and rash.  Allergic/Immunologic: Negative for environmental allergies.  Neurological: Negative for dizziness, syncope and headaches.  Hematological: Negative for adenopathy. Does not bruise/bleed easily.  Psychiatric/Behavioral: Negative for decreased concentration and dysphoric mood. The patient is not nervous/anxious.        Objective:   Physical Exam Constitutional:      General: She is not in acute distress.    Appearance: Normal appearance. She is well-developed. She is obese. She is not ill-appearing or diaphoretic.  HENT:     Head: Normocephalic and atraumatic.     Right Ear: Tympanic membrane, ear canal and external ear normal.     Left Ear: Tympanic membrane, ear canal and external ear normal.     Nose: Nose normal. No congestion.     Mouth/Throat:     Mouth: Mucous membranes are moist.     Pharynx: Oropharynx is clear. No posterior oropharyngeal erythema.  Eyes:     General: No  scleral icterus.    Extraocular Movements: Extraocular movements intact.     Conjunctiva/sclera: Conjunctivae normal.     Pupils: Pupils are equal, round, and reactive to light.  Neck:     Thyroid: No thyromegaly.     Vascular: No carotid bruit or JVD.  Cardiovascular:     Rate and Rhythm: Normal rate and regular rhythm.     Pulses: Normal pulses.     Heart sounds: Normal heart sounds. No gallop.   Pulmonary:     Effort: Pulmonary effort is normal. No respiratory distress.     Breath sounds: Normal breath sounds. No wheezing.     Comments: Good air exch Chest:     Chest wall: No tenderness.  Abdominal:     General: Bowel sounds are normal. There is no distension or abdominal bruit.     Palpations: Abdomen is soft. There is no mass.     Tenderness: There is no abdominal tenderness.     Hernia: No hernia is present.  Genitourinary:    Comments: Breast exam: No mass, nodules, thickening, tenderness, bulging, retraction, inflamation, nipple discharge or skin changes noted.  No axillary or clavicular LA.     Musculoskeletal:        General: No tenderness. Normal range of motion.     Cervical back: Normal range of motion and neck supple. No rigidity. No muscular tenderness.     Right lower leg: No edema.     Left lower leg: No edema.     Comments: No kyphosis   Lymphadenopathy:     Cervical: No cervical  adenopathy.  Skin:    General: Skin is warm and dry.     Coloration: Skin is not pale.     Findings: No erythema or rash.     Comments: Some sks and lentigines Mildly tanned  Neurological:     Mental Status: She is alert. Mental status is at baseline.     Cranial Nerves: No cranial nerve deficit.     Motor: No abnormal muscle tone.     Coordination: Coordination normal.     Gait: Gait normal.     Deep Tendon Reflexes: Reflexes are normal and symmetric. Reflexes normal.  Psychiatric:        Mood and Affect: Mood normal.        Cognition and Memory: Cognition and memory normal.            Assessment & Plan:   Problem List Items Addressed This Visit      Musculoskeletal and Integument   Osteoporosis    Continue alendronate (has been on for a year) One fall with pelvic fx last summer Disc fall prevention  Ca and D Will inc D intake  Exercise -working on  dexa ordered       Relevant Medications   alendronate (FOSAMAX) 70 MG tablet     Other   PURE HYPERCHOLESTEROLEMIA    Disc goals for lipids and reasons to control them Rev last labs with pt Rev low sat fat diet in detail LDL up in 160s with good HDL  Will watch diet/handout given and re check in 2 mo       Relevant Orders   Lipid panel   Routine general medical examination at a health care facility - Primary    Reviewed health habits including diet and exercise and skin cancer prevention Reviewed appropriate screening tests for age  Also reviewed health mt list, fam hx and immunization status , as well as social and family history   See HPI Labs reviewed  Mammogram scheduled tomorrow Ref done for screen colonoscopy  dexa ordered  pna 23 vaccine today  Discussed shingrix vaccine  covid immunized        Depression, major, recurrent (HCC)    Doing fairly well with sertraline 100 mg daily  Encouraged good self care      Relevant Medications   sertraline (ZOLOFT) 100 MG tablet   Estrogen deficiency   Relevant Orders   DG Bone Density   Class 2 obesity due to excess calories with body mass index (BMI) of 36.0 to 36.9 in adult    Discussed how this problem influences overall health and the risks it imposes  Reviewed plan for weight loss with lower calorie diet (via better food choices and also portion control or program like weight watchers) and exercise building up to or more than 30 minutes 5 days per week including some aerobic activity         Colon cancer screening    Referral done for screening colonoscopy      Relevant Orders   Ambulatory referral to Gastroenterology    Elevated glucose    Not technically fasting  119 recent disc imp of low glycemic diet and wt loss to prevent DM2  Will do a1c next draw      Relevant Orders   Hemoglobin A1c    Other Visit Diagnoses    Need for pneumococcal vaccination       Relevant Orders   Pneumococcal polysaccharide vaccine 23-valent greater than or equal to 2yo subcutaneous/IM (  Completed)

## 2020-09-21 NOTE — Assessment & Plan Note (Signed)
Continue alendronate (has been on for a year) One fall with pelvic fx last summer Disc fall prevention  Ca and D Will inc D intake  Exercise -working on  dexa ordered

## 2020-09-21 NOTE — Patient Instructions (Addendum)
Take vitamin D3 over the counter 2000 iu daily   Pneumonia vaccine today   If you are interested in the new shingles vaccine (Shingrix) - call your local pharmacy to check on coverage and availability  If affordable, get on a wait list at your pharmacy to get the vaccine.  You will get a call about a colonoscopy   Call and schedule your bone density test Get your mammogram as planned   Avoid red meat/ fried foods/ egg yolks/ fatty breakfast meats/ butter, cheese and high fat dairy/ and shellfish   Let's check cholesterol in 2 months   To prevent diabetes Try to get most of your carbohydrates from produce (with the exception of white potatoes)  Eat less bread/pasta/rice/snack foods/cereals/sweets and other items from the middle of the grocery store (processed carbs)  Make an appt with Dr Patsy Lager for hip and leg pain

## 2020-09-21 NOTE — Assessment & Plan Note (Signed)
Not technically fasting  119 recent disc imp of low glycemic diet and wt loss to prevent DM2  Will do a1c next draw

## 2020-09-21 NOTE — Assessment & Plan Note (Signed)
Discussed how this problem influences overall health and the risks it imposes  Reviewed plan for weight loss with lower calorie diet (via better food choices and also portion control or program like weight watchers) and exercise building up to or more than 30 minutes 5 days per week including some aerobic activity    

## 2020-09-21 NOTE — Assessment & Plan Note (Signed)
Doing fairly well with sertraline 100 mg daily  Encouraged good self care

## 2020-09-21 NOTE — Assessment & Plan Note (Signed)
Disc goals for lipids and reasons to control them Rev last labs with pt Rev low sat fat diet in detail LDL up in 160s with good HDL  Will watch diet/handout given and re check in 2 mo

## 2020-09-21 NOTE — Assessment & Plan Note (Signed)
Reviewed health habits including diet and exercise and skin cancer prevention Reviewed appropriate screening tests for age  Also reviewed health mt list, fam hx and immunization status , as well as social and family history   See HPI Labs reviewed  Mammogram scheduled tomorrow Ref done for screen colonoscopy  dexa ordered  pna 23 vaccine today  Discussed shingrix vaccine  covid immunized

## 2020-09-22 ENCOUNTER — Ambulatory Visit
Admission: RE | Admit: 2020-09-22 | Discharge: 2020-09-22 | Disposition: A | Discharge status: Home / Self Care | Payer: Medicare PPO | Source: Ambulatory Visit | Attending: Family Medicine | Admitting: Family Medicine

## 2020-09-22 DIAGNOSIS — Z780 Asymptomatic menopausal state: Secondary | ICD-10-CM | POA: Diagnosis not present

## 2020-09-22 DIAGNOSIS — M810 Age-related osteoporosis without current pathological fracture: Secondary | ICD-10-CM | POA: Diagnosis not present

## 2020-09-22 DIAGNOSIS — Z1231 Encounter for screening mammogram for malignant neoplasm of breast: Secondary | ICD-10-CM | POA: Insufficient documentation

## 2020-09-22 DIAGNOSIS — E2839 Other primary ovarian failure: Secondary | ICD-10-CM

## 2020-10-05 NOTE — Progress Notes (Signed)
Emma Akkerman T. Lynnex Fulp, MD, CAQ Sports Medicine  Primary Care and Sports Medicine [facility] [address] [place], [address]  Phone: [phone]  FAX: [phone]  Emma DIA - 65 y.o. Knight  MRN [id number]  Date of Birth: 01/13/1956  Date: 10/06/2020  PCP: Judy Pimple, MD  Referral: Judy Pimple, MD  Chief Complaint  Patient presents with  . Hip Pain    Right     This visit occurred during the SARS-CoV-2 public health emergency.  Safety protocols were in place, including screening questions prior to the visit, additional usage of staff PPE, and extensive cleaning of exam room while observing appropriate contact time as indicated for disinfecting solutions.   Subjective:   Emma Knight is a 66 y.o. very pleasant Knight patient with Body mass index is 35.26 kg/m. who presents with the following:  She presents with concerns about R sided hip and leg pain.  Chart reviewed, and notably with 2021 L sided sacral fracture, L iliac bone, anterior acetabulum, and L inferior pubic ramus on CT pelvis review.   4-5 months ago, had some pain laterally Occ R LE - come and go. This past Thursday, was sitting on a stool. Trouble straightening up. A little better over the past couple days.   Acutely when got off of her stool She is having pain in the posterior pelvis and buttocks region.  She also is having some tenderness in the lowest part of the lumbar spine.  She denies numbness, tingling, weakness in the lower extremity.  Heating pad, ice, tried some tramadol.  Took with advil. Found some muscle relaxers. These did seem to help a little bit.  She denies any bowel or bladder incontinence or any upper leg or pelvic anesthesia.  L upper trap. Neck ROM is great.  Review of Systems is noted in the HPI, as appropriate   Objective:   BP 124/82   Pulse 86   Temp (!) 97.1 F (36.2 C) (Temporal)   Ht 5' 4.25" (1.632 m)    Wt 207 lb (93.9 kg)   SpO2 99%   BMI 35.26 kg/m     GEN: alert,appropriate PSYCH: Normally interactive. Cooperative during the interview.   CERVICAL SPINE EXAM Range of motion: Flexion, extension, lateral bending, and rotation: Full Pain with terminal motion: Minimal to none Spinous Processes: NT SCM: NT Upper paracervical muscles: None Upper traps: Minimum left upper C5-T1 intact, sensation and motor   Range of motion at  the waist: Flexion, extension, lateral bending and rotation: Forward flexion to 70 degrees.  Normal extension with mild pain, lateral bending and rotational movements are preserved.  No echymosis or edema Rises to examination table with mild difficulty Gait: minimally antalgic  Inspection/Deformity: N Paraspinus Tenderness: L4-S1 right greater than left  B Ankle Dorsiflexion (L5,4): 5/5 B Great Toe Dorsiflexion (L5,4): 5/5 Heel Walk (L5): WNL Toe Walk (S1): WNL Rise/Squat (L4): WNL, mild pain  SENSORY B Medial Foot (L4): WNL B Dorsum (L5): WNL B Lateral (S1): WNL Light Touch: WNL Pinprick: WNL  REFLEXES Knee (L4): 2+ Ankle (S1): 2+  B SLR, seated: neg B SLR, supine: neg B FABER: neg B Reverse FABER: neg B Greater Troch: NT B Log Roll: neg B Stork: NT B Sciatic Notch: NT   Radiology:   Assessment and Plan:     ICD-10-CM   1. Acute right-sided low back pain without sciatica  M54.50   2. Acute right hip  pain  M25.551   3. History of pelvic fracture  Z87.81    Acute back pain with some posterior pelvic and posterior hip pain without anterior groin pain in the setting of prior complicated left-sided multipart pelvic fracture.  I gave her some basic rehab to begin, Robaxin to use at night.  I think that this is predominantly muscular.  Patient Instructions  Over the counter Motrin, Advil, or Generic Ibuprofen 200 mg tablets. 3 tablets by mouth 3 times a day. This equals a prescription strength dose.)     Meds ordered this  encounter  Medications  . methocarbamol (ROBAXIN) 500 MG tablet    Sig: Take 1 tablet (500 mg total) by mouth at bedtime as needed for muscle spasms.    Dispense:  30 tablet    Refill:  1   There are no discontinued medications. No orders of the defined types were placed in this encounter.   Follow-up: If not improving.  Signed,  Elpidio Galea. Lathyn Griggs, MD   Outpatient Encounter Medications as of 10/06/2020  Medication Sig  . alendronate (FOSAMAX) 70 MG tablet TAKE 1 TABLET BY MOUTH EVERY 7 DAYS. TAKE WITH A FULL GLASS OF WATER ON AN EMPTY STOMACH.  . methocarbamol (ROBAXIN) 500 MG tablet Take 1 tablet (500 mg total) by mouth at bedtime as needed for muscle spasms.  Marland Kitchen sertraline (ZOLOFT) 100 MG tablet TAKE 1 TABLET BY MOUTH AT BEDTIME   No facility-administered encounter medications on file as of 10/06/2020.

## 2020-10-06 ENCOUNTER — Encounter: Payer: Self-pay | Admitting: Family Medicine

## 2020-10-06 ENCOUNTER — Ambulatory Visit: Payer: Medicare PPO | Admitting: Family Medicine

## 2020-10-06 ENCOUNTER — Other Ambulatory Visit: Payer: Self-pay

## 2020-10-06 VITALS — BP 124/82 | HR 86 | Temp 97.1°F | Ht 64.25 in | Wt 207.0 lb

## 2020-10-06 DIAGNOSIS — M25551 Pain in right hip: Secondary | ICD-10-CM

## 2020-10-06 DIAGNOSIS — Z8781 Personal history of (healed) traumatic fracture: Secondary | ICD-10-CM

## 2020-10-06 DIAGNOSIS — M545 Low back pain, unspecified: Secondary | ICD-10-CM | POA: Diagnosis not present

## 2020-10-06 MED ORDER — METHOCARBAMOL 500 MG PO TABS: 500.0000 mg | ORAL_TABLET | Freq: Every evening | ORAL | 1 refills | Status: AC | PRN

## 2020-10-06 NOTE — Patient Instructions (Signed)
Over the counter Motrin, Advil, or Generic Ibuprofen 200 mg tablets. 3 tablets by mouth 3 times a day. This equals a prescription strength dose.)

## 2020-10-12 ENCOUNTER — Other Ambulatory Visit: Payer: Self-pay

## 2020-11-05 ENCOUNTER — Telehealth (INDEPENDENT_AMBULATORY_CARE_PROVIDER_SITE_OTHER): Payer: Medicare PPO | Admitting: Gastroenterology

## 2020-11-05 ENCOUNTER — Other Ambulatory Visit: Payer: Self-pay

## 2020-11-05 DIAGNOSIS — Z1211 Encounter for screening for malignant neoplasm of colon: Secondary | ICD-10-CM

## 2020-11-05 MED ORDER — NA SULFATE-K SULFATE-MG SULF 17.5-3.13-1.6 GM/177ML PO SOLN
1.0000 | Freq: Once | ORAL | 0 refills | Status: AC
  Filled 2020-11-05: qty 354, 1d supply, fill #0

## 2020-11-05 NOTE — Progress Notes (Signed)
Gastroenterology Pre-Procedure Review  Request Date: 11/16/20 Requesting Physician: Dr. Tobi Bastos  PATIENT REVIEW QUESTIONS: The patient responded to the following health history questions as indicated:    1. Are you having any GI issues? no 2. Do you have a personal history of Polyps?  12/16/2007 no polyps removed. 3. Do you have a family history of Colon Cancer or Polyps? no 4. Diabetes Mellitus? no 5. Joint replacements in the past 12 months?no 6. Major health problems in the past 3 months?no 7. Any artificial heart valves, MVP, or defibrillator?no    MEDICATIONS & ALLERGIES:    Patient reports the following regarding taking any anticoagulation/antiplatelet therapy:   Plavix, Coumadin, Eliquis, Xarelto, Lovenox, Pradaxa, Brilinta, or Effient? no Aspirin? no  Patient confirms/reports the following medications:  Current Outpatient Medications  Medication Sig Dispense Refill   alendronate (FOSAMAX) 70 MG tablet TAKE 1 TABLET BY MOUTH EVERY 7 DAYS. TAKE WITH A FULL GLASS OF WATER ON AN EMPTY STOMACH. 12 tablet 3   methocarbamol (ROBAXIN) 500 MG tablet Take 1 tablet (500 mg total) by mouth at bedtime as needed for muscle spasms. 30 tablet 1   sertraline (ZOLOFT) 100 MG tablet TAKE 1 TABLET BY MOUTH AT BEDTIME 90 tablet 3   No current facility-administered medications for this visit.    Patient confirms/reports the following allergies:  No Known Allergies  No orders of the defined types were placed in this encounter.   AUTHORIZATION INFORMATION Primary Insurance: 1D#: Group #:  Secondary Insurance: 1D#: Group #:  SCHEDULE INFORMATION: Date: 11/16/20 Time: Location: [facility]

## 2020-11-11 ENCOUNTER — Telehealth: Payer: Self-pay

## 2020-11-11 ENCOUNTER — Telehealth: Payer: Self-pay | Admitting: Gastroenterology

## 2020-11-11 NOTE — Telephone Encounter (Signed)
Emma Knight  We thought the last one was in 09 so that is good Please cancel the referral and enter new date in health mt or send for report if not already in Thanks

## 2020-11-11 NOTE — Telephone Encounter (Signed)
Patient called to cancel 11/16/2020 procedure. Message was sent to clinic staff to cancel procedure.

## 2020-11-11 NOTE — Telephone Encounter (Signed)
Patient is calling in stating she was recommended to have a colonoscopy and was referred to [facility]. Jan states she wanted to go to her old GI doctor to have it done, when calling them to see if they would take her -they advised that her last colonoscopy was last done 05/2018 and she isnt due for another one. Wants to know if Dr.Tower received those notes from her previous GI.

## 2020-11-12 ENCOUNTER — Other Ambulatory Visit: Payer: Self-pay

## 2020-11-12 NOTE — Telephone Encounter (Signed)
Patient was advised that her last colonoscopy was last done 05/2018 from previous GI and she isnt due for another one

## 2020-11-16 ENCOUNTER — Encounter: Admission: RE | Payer: Self-pay | Source: Home / Self Care

## 2020-11-16 ENCOUNTER — Ambulatory Visit: Admission: RE | Admit: 2020-11-16 | Payer: Medicare PPO | Source: Home / Self Care | Admitting: Gastroenterology

## 2020-11-16 SURGERY — COLONOSCOPY WITH PROPOFOL
Anesthesia: General

## 2020-11-22 ENCOUNTER — Other Ambulatory Visit (INDEPENDENT_AMBULATORY_CARE_PROVIDER_SITE_OTHER): Payer: Medicare PPO

## 2020-11-22 ENCOUNTER — Other Ambulatory Visit: Payer: Self-pay

## 2020-11-22 DIAGNOSIS — E7800 Pure hypercholesterolemia, unspecified: Secondary | ICD-10-CM | POA: Diagnosis not present

## 2020-11-22 DIAGNOSIS — R7309 Other abnormal glucose: Secondary | ICD-10-CM | POA: Diagnosis not present

## 2020-11-22 LAB — LIPID PANEL
Cholesterol: 220 mg/dL — ABNORMAL HIGH (ref 0–200)
HDL: 67.3 mg/dL (ref >39.00)
LDL Cholesterol: 134 mg/dL — ABNORMAL HIGH (ref 0–99)
NonHDL: 152.5
Total CHOL/HDL Ratio: 3
Triglycerides: 94 mg/dL (ref 0.0–149.0)
VLDL: 18.8 mg/dL (ref 0.0–40.0)

## 2020-11-22 LAB — HEMOGLOBIN A1C: Hgb A1c MFr Bld: 5.3 % (ref 4.6–6.5)

## 2020-11-25 NOTE — Telephone Encounter (Signed)
Reviewed patient's chart. I can see that she was scheduled to have Colonoscopy done with [facility] GI office on 06/03/2018 but do not see an actual record. Faxed request.Will update health mt once we have an actual report.

## 2020-11-30 NOTE — Telephone Encounter (Signed)
Mrs. Carreras called in and stated that she doesn't need the colonoscopy and she had it done in 2020.

## 2020-12-17 DIAGNOSIS — H53483 Generalized contraction of visual field, bilateral: Secondary | ICD-10-CM | POA: Diagnosis not present

## 2020-12-17 DIAGNOSIS — H02411 Mechanical ptosis of right eyelid: Secondary | ICD-10-CM | POA: Diagnosis not present

## 2020-12-17 DIAGNOSIS — H02412 Mechanical ptosis of left eyelid: Secondary | ICD-10-CM | POA: Diagnosis not present

## 2020-12-17 DIAGNOSIS — H57813 Brow ptosis, bilateral: Secondary | ICD-10-CM | POA: Diagnosis not present

## 2020-12-17 DIAGNOSIS — H0279 Other degenerative disorders of eyelid and periocular area: Secondary | ICD-10-CM | POA: Diagnosis not present

## 2020-12-17 DIAGNOSIS — H02413 Mechanical ptosis of bilateral eyelids: Secondary | ICD-10-CM | POA: Diagnosis not present

## 2020-12-17 DIAGNOSIS — H02834 Dermatochalasis of left upper eyelid: Secondary | ICD-10-CM | POA: Diagnosis not present

## 2020-12-17 DIAGNOSIS — H02423 Myogenic ptosis of bilateral eyelids: Secondary | ICD-10-CM | POA: Diagnosis not present

## 2020-12-17 DIAGNOSIS — H02831 Dermatochalasis of right upper eyelid: Secondary | ICD-10-CM | POA: Diagnosis not present

## 2020-12-27 ENCOUNTER — Other Ambulatory Visit: Payer: Self-pay

## 2021-01-17 ENCOUNTER — Other Ambulatory Visit: Payer: Self-pay

## 2021-02-03 DIAGNOSIS — M810 Age-related osteoporosis without current pathological fracture: Secondary | ICD-10-CM | POA: Diagnosis not present

## 2021-02-03 DIAGNOSIS — Z7983 Long term (current) use of bisphosphonates: Secondary | ICD-10-CM | POA: Diagnosis not present

## 2021-02-03 DIAGNOSIS — E669 Obesity, unspecified: Secondary | ICD-10-CM | POA: Diagnosis not present

## 2021-02-03 DIAGNOSIS — F3342 Major depressive disorder, recurrent, in full remission: Secondary | ICD-10-CM | POA: Diagnosis not present

## 2021-02-26 ENCOUNTER — Other Ambulatory Visit: Payer: Self-pay

## 2021-02-26 MED ORDER — FLUARIX QUADRIVALENT 0.5 ML IM SUSY
PREFILLED_SYRINGE | INTRAMUSCULAR | 0 refills | Status: DC
  Filled 2021-02-26: qty 0.5, 1d supply, fill #0

## 2021-02-26 MED ORDER — FLUAD QUADRIVALENT 0.5 ML IM PRSY
PREFILLED_SYRINGE | INTRAMUSCULAR | 0 refills | Status: DC
  Filled 2021-02-26: qty 0.5, 1d supply, fill #0

## 2021-03-14 ENCOUNTER — Other Ambulatory Visit: Payer: Self-pay

## 2021-04-18 ENCOUNTER — Other Ambulatory Visit: Payer: Self-pay

## 2021-07-14 ENCOUNTER — Other Ambulatory Visit: Payer: Self-pay

## 2021-08-09 ENCOUNTER — Other Ambulatory Visit: Payer: Self-pay

## 2021-08-10 ENCOUNTER — Other Ambulatory Visit: Payer: Self-pay

## 2021-09-15 ENCOUNTER — Ambulatory Visit (INDEPENDENT_AMBULATORY_CARE_PROVIDER_SITE_OTHER): Payer: Medicare PPO

## 2021-09-15 VITALS — Wt 207.0 lb

## 2021-09-15 DIAGNOSIS — Z1231 Encounter for screening mammogram for malignant neoplasm of breast: Secondary | ICD-10-CM

## 2021-09-15 DIAGNOSIS — Z0000 Encounter for general adult medical examination without abnormal findings: Secondary | ICD-10-CM

## 2021-09-15 NOTE — Progress Notes (Signed)
?Virtual Visit via Telephone Note ? ?I connected with  Emma Knight on 09/15/21 at 11:00 AM EDT by telephone and verified that I am speaking with the correct person using two identifiers. ? ?Location: ?Patient: home ?Provider: [facility] [place] ?Persons participating in the virtual visit: patient/Nurse Health Advisor ?  ?I discussed the limitations, risks, security and privacy concerns of performing an evaluation and management service by telephone and the availability of in person appointments. The patient expressed understanding and agreed to proceed. ? ?Interactive audio and video telecommunications were attempted between this nurse and patient, however failed, due to patient having technical difficulties OR patient did not have access to video capability.  We continued and completed visit with audio only. ? ?Some vital signs may be absent or patient reported.  ? ?Emma HopeLorrie S Rigby Swamy, LPN ? ?Subjective:  ? Emma Knight is a 66 y.o. female who presents for Medicare Annual (Subsequent) preventive examination. ? ?Review of Systems    ? ?  ? ?   ?Objective:  ?  ?There were no vitals filed for this visit. ?There is no height or weight on file to calculate BMI. ? ? ?  09/14/2020  ? 11:23 AM  ?Advanced Directives  ?Does Patient Have a Medical Advance Directive? Yes  ?Type of Estate agentAdvance Directive Healthcare Power of [place];Living will  ?Copy of Healthcare Power of Attorney in Chart? No - copy requested  ? ? ?Current Medications (verified) ?Outpatient Encounter Medications as of 09/15/2021  ?Medication Sig  ? alendronate (FOSAMAX) 70 MG tablet TAKE 1 TABLET BY MOUTH EVERY 7 DAYS. TAKE WITH A FULL GLASS OF WATER ON AN EMPTY STOMACH.  ? influenza vaccine adjuvanted (FLUAD QUADRIVALENT) 0.5 ML injection Inject into the muscle.  ? methocarbamol (ROBAXIN) 500 MG tablet Take 1 tablet (500 mg total) by mouth at bedtime as needed for muscle spasms.  ? sertraline (ZOLOFT) 100 MG tablet TAKE 1 TABLET BY MOUTH AT BEDTIME  ? ?No  facility-administered encounter medications on file as of 09/15/2021.  ? ? ?Allergies (verified) ?Patient has no known allergies.  ? ?History: ?Past Medical History:  ?Diagnosis Date  ? Hyperlipidemia   ? Osteopenia   ? Sleep apnea   ? ?No past surgical history on file. ?Family History  ?Problem Relation Age of Onset  ? Bladder Cancer Brother   ? Breast cancer Neg Hx   ? ?Social History  ? ?Socioeconomic History  ? Marital status: Divorced  ?  Spouse name: Not on file  ? Number of children: Not on file  ? Years of education: Not on file  ? Highest education level: Not on file  ?Occupational History  ? Not on file  ?Tobacco Use  ? Smoking status: Never  ? Smokeless tobacco: Never  ?Substance and Sexual Activity  ? Alcohol use: Yes  ?  Alcohol/week: 0.0 standard drinks  ?  Comment: rarely  ? Drug use: No  ? Sexual activity: Not on file  ?Other Topics Concern  ? Not on file  ?Social History Narrative  ? Not on file  ? ?Social Determinants of Health  ? ?Financial Resource Strain: Low Risk   ? Difficulty of Paying Living Expenses: Not hard at all  ?Food Insecurity: No Food Insecurity  ? Worried About Programme researcher, broadcasting/film/videounning Out of Food in the Last Year: Never true  ? Ran Out of Food in the Last Year: Never true  ?Transportation Needs: No Transportation Needs  ? Lack of Transportation (Medical): No  ? Lack of Transportation (Non-Medical):  No  ?Physical Activity: Inactive  ? Days of Exercise per Week: 0 days  ? Minutes of Exercise per Session: 0 min  ?Stress: No Stress Concern Present  ? Feeling of Stress : Not at all  ?Social Connections: Not on file  ? ? ?Tobacco Counseling ?Counseling given: Not Answered ? ? ?Clinical Intake: ? ?Pre-visit preparation completed: Yes ? ?Pain : No/denies pain ? ?  ? ?Nutritional Risks: None ?Diabetes: No ? ?How often do you need to have someone help you when you read instructions, pamphlets, or other written materials from your doctor or pharmacy?: 1 - Never ? ?Diabetic?no ? ?Interpreter Needed?:  No ? ?Information entered by :: Kennedy Bucker, LPN ? ? ?Activities of Daily Living ?   ? View : No data to display.  ?  ?  ?  ? ? ?Patient Care Team: ?Tower, Audrie Gallus, MD as PCP - General ? ?Indicate any recent Medical Services you may have received from other than [facility] providers in the past year (date may be approximate). ? ?   ?Assessment:  ? This is a routine wellness examination for Emma Knight. ? ?Hearing/Vision screen ?No results found. ? ?Dietary issues and exercise activities discussed: ?  ? ? Goals Addressed   ?None ?  ? ?Depression Screen ? ?  09/14/2020  ? 11:27 AM 07/23/2019  ?  2:01 PM 03/22/2018  ? 12:26 PM 05/14/2017  ?  9:30 AM 03/23/2016  ? 10:58 AM 02/25/2016  ?  4:35 PM 09/20/2012  ? 11:08 AM  ?PHQ 2/9 Scores  ?PHQ - 2 Score 0 0 0 0 4 3 0  ?PHQ- 9 Score 0  0 0 7 7   ?  ?Fall Risk ? ?  09/14/2020  ? 11:26 AM 07/23/2019  ?  2:00 PM  ?Fall Risk   ?Falls in the past year? 0 0  ?Number falls in past yr: 0 0  ?Injury with Fall? 0   ?Risk for fall due to : No Fall Risks   ?Follow up Falls evaluation completed;Falls prevention discussed Falls evaluation completed  ? ? ?FALL RISK PREVENTION PERTAINING TO THE HOME: ? ?Any stairs in or around the home? No  ?If so, are there any without handrails? No  ?Home free of loose throw rugs in walkways, pet beds, electrical cords, etc? Yes  ?Adequate lighting in your home to reduce risk of falls? Yes  ? ?ASSISTIVE DEVICES UTILIZED TO PREVENT FALLS: ? ?Life alert? No  ?Use of a cane, walker or w/c? No  ?Grab bars in the bathroom? No  ?Shower chair or bench in shower? Yes  ?Elevated toilet seat or a handicapped toilet? Yes  ? ?Cognitive Function:0 points 6CIT ? ? ? ?  09/14/2020  ? 11:28 AM  ?MMSE - Mini Mental State Exam  ?Not completed: Refused  ? ?  ?  ? ?Immunizations ?Immunization History  ?Administered Date(s) Administered  ? Fluad Quad(high Dose 65+) 02/26/2021  ? Influenza Inj Mdck Quad With Preservative 02/23/2018  ? Influenza Whole 01/25/2009, 02/26/2021  ?  Influenza,inj,Quad PF,6+ Mos 03/06/2014, 02/25/2016  ? Influenza-Unspecified 01/21/2015, 02/04/2017, 01/21/2019  ? PFIZER(Purple Top)SARS-COV-2 Vaccination 05/20/2019, 06/10/2019, 02/27/2020  ? Pneumococcal Conjugate-13 07/23/2019  ? Pneumococcal Polysaccharide-23 09/21/2020  ? Td 05/09/2003  ? Tdap 03/18/2014  ? ? ?TDAP status: Up to date ? ?Flu Vaccine status: Up to date ? ?Pneumococcal vaccine status: Up to date ? ?Covid-19 vaccine status: Completed vaccines ? ?Qualifies for Shingles Vaccine? Yes   ?Zostavax completed No   ?Shingrix  Completed?: No.    Education has been provided regarding the importance of this vaccine. Patient has been advised to call insurance company to determine out of pocket expense if they have not yet received this vaccine. Advised may also receive vaccine at local pharmacy or Health Dept. Verbalized acceptance and understanding. ? ?Screening Tests ?Health Maintenance  ?Topic Date Due  ? Zoster Vaccines- Shingrix (1 of 2) Never done  ? COVID-19 Vaccine (4 - Booster for Pfizer series) 04/23/2020  ? MAMMOGRAM  09/22/2021  ? INFLUENZA VACCINE  12/06/2021  ? DEXA SCAN  09/23/2022  ? TETANUS/TDAP  03/18/2024  ? COLONOSCOPY (Pts 45-30yrs Insurance coverage will need to be confirmed)  06/03/2028  ? Pneumonia Vaccine 1+ Years old  Completed  ? Hepatitis C Screening  Completed  ? HPV VACCINES  Aged Out  ? ? ?Health Maintenance ? ?Health Maintenance Due  ?Topic Date Due  ? Zoster Vaccines- Shingrix (1 of 2) Never done  ? COVID-19 Vaccine (4 - Booster for Pfizer series) 04/23/2020  ? ? ?Colorectal cancer screening: Type of screening: Colonoscopy. Completed 06/03/18. Repeat every 10 years ? ?Mammogram status: Completed 09/22/20. Repeat every year ? ?Bone Density status: Completed 09/22/20. Results reflect: Bone density results: OSTEOPOROSIS. Repeat every 2 years. ? ?Lung Cancer Screening: (Low Dose CT Chest recommended if Age 49-80 years, 30 pack-year currently smoking OR have quit w/in 15years.) does  not qualify.  ? ?Additional Screening: ? ?Hepatitis C Screening: does qualify; Completed 03/22/18 ? ?Vision Screening: Recommended annual ophthalmology exams for early detection of glaucoma and other disorders of the eye. ?Is the pat

## 2021-09-15 NOTE — Patient Instructions (Signed)
Emma Knight , ?Thank you for taking time to come for your Medicare Wellness Visit. I appreciate your ongoing commitment to your health goals. Please review the following plan we discussed and let me know if I can assist you in the future.  ? ?Screening recommendations/referrals: ?Colonoscopy: 06/03/18 ?Mammogram: 09/22/20 ?Bone Density: 09/22/20 ?Recommended yearly ophthalmology/optometry visit for glaucoma screening and checkup ?Recommended yearly dental visit for hygiene and checkup ? ?Vaccinations: ?Influenza vaccine: 02/26/21 ?Pneumococcal vaccine: 09/21/20 ?Tdap vaccine: 03/18/14 ?Shingles vaccine: n/d   ?Covid-19:05/20/19, 06/10/19, 02/27/20 ? ?Advanced directives: no ? ?Conditions/risks identified: none ? ?Next appointment: Follow up in one year for your annual wellness visit 09/19/22 @ 11am by phone ? ? ?Preventive Care 25 Years and Older, Female ?Preventive care refers to lifestyle choices and visits with your health care provider that can promote health and wellness. ?What does preventive care include? ?A yearly physical exam. This is also called an annual well check. ?Dental exams once or twice a year. ?Routine eye exams. Ask your health care provider how often you should have your eyes checked. ?Personal lifestyle choices, including: ?Daily care of your teeth and gums. ?Regular physical activity. ?Eating a healthy diet. ?Avoiding tobacco and drug use. ?Limiting alcohol use. ?Practicing safe sex. ?Taking low-dose aspirin every day. ?Taking vitamin and mineral supplements as recommended by your health care provider. ?What happens during an annual well check? ?The services and screenings done by your health care provider during your annual well check will depend on your age, overall health, lifestyle risk factors, and family history of disease. ?Counseling  ?Your health care provider may ask you questions about your: ?Alcohol use. ?Tobacco use. ?Drug use. ?Emotional well-being. ?Home and relationship  well-being. ?Sexual activity. ?Eating habits. ?History of falls. ?Memory and ability to understand (cognition). ?Work and work Astronomer. ?Reproductive health. ?Screening  ?You may have the following tests or measurements: ?Height, weight, and BMI. ?Blood pressure. ?Lipid and cholesterol levels. These may be checked every 5 years, or more frequently if you are over 35 years old. ?Skin check. ?Lung cancer screening. You may have this screening every year starting at age 66 if you have a 30-pack-year history of smoking and currently smoke or have quit within the past 15 years. ?Fecal occult blood test (FOBT) of the stool. You may have this test every year starting at age 87. ?Flexible sigmoidoscopy or colonoscopy. You may have a sigmoidoscopy every 5 years or a colonoscopy every 10 years starting at age 60. ?Hepatitis C blood test. ?Hepatitis B blood test. ?Sexually transmitted disease (STD) testing. ?Diabetes screening. This is done by checking your blood sugar (glucose) after you have not eaten for a while (fasting). You may have this done every 1-3 years. ?Bone density scan. This is done to screen for osteoporosis. You may have this done starting at age 54. ?Mammogram. This may be done every 1-2 years. Talk to your health care provider about how often you should have regular mammograms. ?Talk with your health care provider about your test results, treatment options, and if necessary, the need for more tests. ?Vaccines  ?Your health care provider may recommend certain vaccines, such as: ?Influenza vaccine. This is recommended every year. ?Tetanus, diphtheria, and acellular pertussis (Tdap, Td) vaccine. You may need a Td booster every 10 years. ?Zoster vaccine. You may need this after age 22. ?Pneumococcal 13-valent conjugate (PCV13) vaccine. One dose is recommended after age 87. ?Pneumococcal polysaccharide (PPSV23) vaccine. One dose is recommended after age 22. ?Talk to your health care provider  about which  screenings and vaccines you need and how often you need them. ?This information is not intended to replace advice given to you by your health care provider. Make sure you discuss any questions you have with your health care provider. ?Document Released: 05/21/2015 Document Revised: 01/12/2016 Document Reviewed: 02/23/2015 ?Elsevier Interactive Patient Education ? 2017 [place]. ? ?Fall Prevention in the Home ?Falls can cause injuries. They can happen to people of all ages. There are many things you can do to make your home safe and to help prevent falls. ?What can I do on the outside of my home? ?Regularly fix the edges of walkways and driveways and fix any cracks. ?Remove anything that might make you trip as you walk through a door, such as a raised step or threshold. ?Trim any bushes or trees on the path to your home. ?Use bright outdoor lighting. ?Clear any walking paths of anything that might make someone trip, such as rocks or tools. ?Regularly check to see if handrails are loose or broken. Make sure that both sides of any steps have handrails. ?Any raised decks and porches should have guardrails on the edges. ?Have any leaves, snow, or ice cleared regularly. ?Use sand or salt on walking paths during winter. ?Clean up any spills in your garage right away. This includes oil or grease spills. ?What can I do in the bathroom? ?Use night lights. ?Install grab bars by the toilet and in the tub and shower. Do not use towel bars as grab bars. ?Use non-skid mats or decals in the tub or shower. ?If you need to sit down in the shower, use a plastic, non-slip stool. ?Keep the floor dry. Clean up any water that spills on the floor as soon as it happens. ?Remove soap buildup in the tub or shower regularly. ?Attach bath mats securely with double-sided non-slip rug tape. ?Do not have throw rugs and other things on the floor that can make you trip. ?What can I do in the bedroom? ?Use night lights. ?Make sure that you have a  light by your bed that is easy to reach. ?Do not use any sheets or blankets that are too big for your bed. They should not hang down onto the floor. ?Have a firm chair that has side arms. You can use this for support while you get dressed. ?Do not have throw rugs and other things on the floor that can make you trip. ?What can I do in the kitchen? ?Clean up any spills right away. ?Avoid walking on wet floors. ?Keep items that you use a lot in easy-to-reach places. ?If you need to reach something above you, use a strong step stool that has a grab bar. ?Keep electrical cords out of the way. ?Do not use floor polish or wax that makes floors slippery. If you must use wax, use non-skid floor wax. ?Do not have throw rugs and other things on the floor that can make you trip. ?What can I do with my stairs? ?Do not leave any items on the stairs. ?Make sure that there are handrails on both sides of the stairs and use them. Fix handrails that are broken or loose. Make sure that handrails are as long as the stairways. ?Check any carpeting to make sure that it is firmly attached to the stairs. Fix any carpet that is loose or worn. ?Avoid having throw rugs at the top or bottom of the stairs. If you do have throw rugs, attach them to the floor  with carpet tape. ?Make sure that you have a light switch at the top of the stairs and the bottom of the stairs. If you do not have them, ask someone to add them for you. ?What else can I do to help prevent falls? ?Wear shoes that: ?Do not have high heels. ?Have rubber bottoms. ?Are comfortable and fit you well. ?Are closed at the toe. Do not wear sandals. ?If you use a stepladder: ?Make sure that it is fully opened. Do not climb a closed stepladder. ?Make sure that both sides of the stepladder are locked into place. ?Ask someone to hold it for you, if possible. ?Clearly mark and make sure that you can see: ?Any grab bars or handrails. ?First and last steps. ?Where the edge of each step  is. ?Use tools that help you move around (mobility aids) if they are needed. These include: ?Canes. ?Walkers. ?Scooters. ?Crutches. ?Turn on the lights when you go into a dark area. Replace any light bulbs as soon as they b

## 2021-10-03 DIAGNOSIS — H524 Presbyopia: Secondary | ICD-10-CM | POA: Diagnosis not present

## 2021-10-17 ENCOUNTER — Other Ambulatory Visit: Payer: Self-pay | Admitting: Family Medicine

## 2021-10-17 ENCOUNTER — Encounter: Payer: Self-pay | Admitting: Family Medicine

## 2021-10-18 ENCOUNTER — Other Ambulatory Visit: Payer: Self-pay

## 2021-10-18 MED ORDER — SERTRALINE HCL 100 MG PO TABS
ORAL_TABLET | Freq: Every day | ORAL | 3 refills | Status: DC
  Filled 2021-10-18: qty 90, 90d supply, fill #0
  Filled 2022-01-12: qty 90, 90d supply, fill #1
  Filled 2022-04-06: qty 90, 90d supply, fill #2
  Filled 2022-07-15: qty 90, 90d supply, fill #3

## 2021-10-18 NOTE — Telephone Encounter (Signed)
Patient is going out of town and needs sertrailine refilled. She has an appointment scheduled for 06/26. Pt uses [facility].

## 2021-10-18 NOTE — Telephone Encounter (Signed)
Last office visit 10/06/21 acute Last refill 09/21/20 #90/3

## 2021-10-31 ENCOUNTER — Ambulatory Visit (INDEPENDENT_AMBULATORY_CARE_PROVIDER_SITE_OTHER): Payer: Medicare PPO | Admitting: Family Medicine

## 2021-10-31 ENCOUNTER — Encounter: Payer: Self-pay | Admitting: Family Medicine

## 2021-10-31 VITALS — BP 134/86 | HR 81 | Ht 64.0 in | Wt 215.2 lb

## 2021-10-31 DIAGNOSIS — R739 Hyperglycemia, unspecified: Secondary | ICD-10-CM | POA: Diagnosis not present

## 2021-10-31 DIAGNOSIS — F330 Major depressive disorder, recurrent, mild: Secondary | ICD-10-CM | POA: Diagnosis not present

## 2021-10-31 DIAGNOSIS — E7800 Pure hypercholesterolemia, unspecified: Secondary | ICD-10-CM

## 2021-10-31 DIAGNOSIS — Z1231 Encounter for screening mammogram for malignant neoplasm of breast: Secondary | ICD-10-CM | POA: Diagnosis not present

## 2021-10-31 DIAGNOSIS — M810 Age-related osteoporosis without current pathological fracture: Secondary | ICD-10-CM

## 2021-10-31 DIAGNOSIS — Z0000 Encounter for general adult medical examination without abnormal findings: Secondary | ICD-10-CM | POA: Insufficient documentation

## 2021-10-31 DIAGNOSIS — R7309 Other abnormal glucose: Secondary | ICD-10-CM | POA: Diagnosis not present

## 2021-10-31 DIAGNOSIS — Z6836 Body mass index (BMI) 36.0-36.9, adult: Secondary | ICD-10-CM

## 2021-10-31 DIAGNOSIS — E6609 Other obesity due to excess calories: Secondary | ICD-10-CM | POA: Diagnosis not present

## 2021-10-31 LAB — CBC WITH DIFFERENTIAL/PLATELET
Basophils Absolute: 0 10*3/uL (ref 0.0–0.1)
Basophils Relative: 0.4 % (ref 0.0–3.0)
Eosinophils Absolute: 0 10*3/uL (ref 0.0–0.7)
Eosinophils Relative: 0.5 % (ref 0.0–5.0)
HCT: 42 % (ref 36.0–46.0)
Hemoglobin: 14 g/dL (ref 12.0–15.0)
Lymphocytes Relative: 31.8 % (ref 12.0–46.0)
Lymphs Abs: 1.1 10*3/uL (ref 0.7–4.0)
MCHC: 33.4 g/dL (ref 30.0–36.0)
MCV: 94.2 fl (ref 78.0–100.0)
Monocytes Absolute: 0.3 10*3/uL (ref 0.1–1.0)
Monocytes Relative: 9.3 % (ref 3.0–12.0)
Neutro Abs: 2 10*3/uL (ref 1.4–7.7)
Neutrophils Relative %: 58 % (ref 43.0–77.0)
Platelets: 191 10*3/uL (ref 150.0–400.0)
RBC: 4.46 Mil/uL (ref 3.87–5.11)
RDW: 13.7 % (ref 11.5–15.5)
WBC: 3.5 10*3/uL — ABNORMAL LOW (ref 4.0–10.5)

## 2021-10-31 LAB — LIPID PANEL
Cholesterol: 262 mg/dL — ABNORMAL HIGH (ref 0–200)
HDL: 107.9 mg/dL (ref >39.00)
LDL Cholesterol: 134 mg/dL — ABNORMAL HIGH (ref 0–99)
NonHDL: 154.4
Total CHOL/HDL Ratio: 2
Triglycerides: 103 mg/dL (ref 0.0–149.0)
VLDL: 20.6 mg/dL (ref 0.0–40.0)

## 2021-10-31 LAB — COMPREHENSIVE METABOLIC PANEL
ALT: 16 U/L (ref 0–35)
AST: 17 U/L (ref 0–37)
Albumin: 4.2 g/dL (ref 3.5–5.2)
Alkaline Phosphatase: 62 U/L (ref 39–117)
BUN: 14 mg/dL (ref 6–23)
CO2: 28 mEq/L (ref 19–32)
Calcium: 9.1 mg/dL (ref 8.4–10.5)
Chloride: 103 mEq/L (ref 96–112)
Creatinine, Ser: 0.57 mg/dL (ref 0.40–1.20)
GFR: 94.08 mL/min (ref >60.00)
Glucose, Bld: 95 mg/dL (ref 70–99)
Potassium: 5 mEq/L (ref 3.5–5.1)
Sodium: 139 mEq/L (ref 135–145)
Total Bilirubin: 0.5 mg/dL (ref 0.2–1.2)
Total Protein: 6.5 g/dL (ref 6.0–8.3)

## 2021-10-31 LAB — TSH: TSH: 2.57 u[IU]/mL (ref 0.35–5.50)

## 2021-10-31 LAB — VITAMIN D 25 HYDROXY (VIT D DEFICIENCY, FRACTURES): VITD: 30.52 ng/mL (ref 30.00–100.00)

## 2021-10-31 NOTE — Progress Notes (Signed)
Subjective:    Patient ID: Emma Knight, female    DOB: 04/24/1956, 66 y.o.   MRN: [id number]  HPI Pt presents for amw and health mt visit   I have personally reviewed the Medicare Annual Wellness questionnaire and have noted 1. The patient's medical and social history 2. Their use of alcohol, tobacco or illicit drugs 3. Their current medications and supplements 4. The patient's functional ability including ADL's, fall risks, home safety risks and hearing or visual             impairment. 5. Diet and physical activities 6. Evidence for depression or mood disorders  The patients weight, height, BMI have been recorded in the chart and visual acuity is per eye clinic.  I have made referrals, counseling and provided education to the patient based review of the above and I have provided the pt with a written personalized care plan for preventive services. Reviewed and updated provider list, see scanned forms.  See scanned forms.  Routine anticipatory guidance given to patient.  See health maintenance. Colon cancer screening 05/2018 colonoscopy with 10 y recall Breast cancer screening 09/2020 -has not had one yet  Self breast exam : no lumps  Flu vaccine- 02/2021 Tetanus vaccine  03/2014 Tdap Pneumovax up to date  Zoster vaccine: interested if covered/will call  Dexa 09/2020 stable OP Takes alendronate since 07/2019 and tolerates well  Falls: none  Fractures: none  Supplements:  taking ca and D Exercise : walking outside some  Used to swim but not now    Advance directive: up to date  Cognitive function addressed- see scanned forms- and if abnormal then additional documentation follows.   No changes at all  No change in short term memory  Does her own finances   PMH and SH reviewed  Meds, vitals, and allergies reviewed.   ROS: See HPI.  Otherwise negative.    Weight : Wt Readings from Last 3 Encounters:  10/31/21 215 lb 3.2 oz (97.6 kg)  09/15/21 207 lb (93.9 kg)   10/06/20 207 lb (93.9 kg)   36.94 kg/m  Has been back and forth to the beach  Her best friend died unexpectedly -this has been hard   Is taking care of herself    Hearing/vision: Eye exam was in May -wears glasses   Hearing Screening   250Hz  500Hz  1000Hz  2000Hz  4000Hz   Right ear 40 40 40 40 40  Left ear 40 40 40 40 40     No hearing problems that she knows of , good screen      PHQ:    10/31/2021    9:58 AM 09/15/2021   11:01 AM 09/14/2020   11:27 AM 07/23/2019    2:01 PM 03/22/2018   12:26 PM  Depression screen PHQ 2/9  Decreased Interest 0 0 0 0 0  Down, Depressed, Hopeless 0 0 0 0 0  PHQ - 2 Score 0 0 0 0 0  Altered sleeping   0  0  Tired, decreased energy   0  0  Change in appetite   0  0  Feeling bad or failure about yourself    0  0  Trouble concentrating   0  0  Moving slowly or fidgety/restless   0  0  Suicidal thoughts   0  0  PHQ-9 Score   0  0  Difficult doing work/chores   Not difficult at all       Takes sertraline 100 mg  daily for depression  ADLs: no help needed   Functionality: excellent   Care team : Khyler Urda -pcp Anna= [facility] [place]    Elevated glucose Lab Results  Component Value Date   HGBA1C 5.3 11/22/2020   Due for labs   Hyperlipidemia Lab Results  Component Value Date   CHOL 220 (H) 11/22/2020   HDL 67.30 11/22/2020   LDLCALC 134 (H) 11/22/2020   LDLDIRECT 130.7 08/05/2010   TRIG 94.0 11/22/2020   CHOLHDL 3 11/22/2020     Patient Active Problem List   Diagnosis Date Noted   Medicare annual wellness visit, subsequent 10/31/2021   Encounter for screening mammogram for breast cancer 10/31/2021   Class 2 obesity due to excess calories with body mass index (BMI) of 36.0 to 36.9 in adult 09/21/2020   Colon cancer screening 09/21/2020   Elevated glucose 09/21/2020   Welcome to Medicare preventive visit 07/23/2019   Encounter for hepatitis C screening test for low risk patient 03/22/2018   Encounter for  screening for HIV 03/22/2018   Hydradenitis 05/14/2017   Depression, major, recurrent (HCC) 02/25/2016   Family history of leukemia 03/18/2014   Encounter for routine gynecological examination 09/20/2012   Routine general medical examination at a health care facility 09/16/2012   PURE HYPERCHOLESTEROLEMIA 03/03/2009   Osteoporosis 05/20/2008   Disorder of bone and cartilage 05/20/2008   Past Medical History:  Diagnosis Date   Hyperlipidemia    Osteopenia    Sleep apnea    History reviewed. No pertinent surgical history. Social History   Tobacco Use   Smoking status: Never   Smokeless tobacco: Never  Vaping Use   Vaping Use: Never used  Substance Use Topics   Alcohol use: Yes    Alcohol/week: 0.0 standard drinks of alcohol    Comment: rarely   Drug use: No   Family History  Problem Relation Age of Onset   Bladder Cancer Brother    Breast cancer Neg Hx    No Known Allergies Current Outpatient Medications on File Prior to Visit  Medication Sig Dispense Refill   alendronate (FOSAMAX) 70 MG tablet TAKE 1 TABLET BY MOUTH EVERY 7 DAYS. TAKE WITH A FULL GLASS OF WATER ON AN EMPTY STOMACH. 12 tablet 3   sertraline (ZOLOFT) 100 MG tablet TAKE 1 TABLET BY MOUTH AT BEDTIME 90 tablet 3   No current facility-administered medications on file prior to visit.    Review of Systems  Constitutional:  Negative for activity change, appetite change, fatigue, fever and unexpected weight change.  HENT:  Negative for congestion, ear pain, rhinorrhea, sinus pressure and sore throat.   Eyes:  Negative for pain, redness and visual disturbance.  Respiratory:  Negative for cough, shortness of breath and wheezing.   Cardiovascular:  Negative for chest pain and palpitations.  Gastrointestinal:  Negative for abdominal pain, blood in stool, constipation and diarrhea.  Endocrine: Negative for polydipsia and polyuria.  Genitourinary:  Negative for dysuria, frequency and urgency.  Musculoskeletal:   Negative for arthralgias, back pain and myalgias.  Skin:  Negative for pallor and rash.  Allergic/Immunologic: Negative for environmental allergies.  Neurological:  Negative for dizziness, syncope and headaches.  Hematological:  Negative for adenopathy. Does not bruise/bleed easily.  Psychiatric/Behavioral:  Negative for decreased concentration and dysphoric mood. The patient is not nervous/anxious.        Grief       Objective:   Physical Exam Constitutional:      General: She is not in acute  distress.    Appearance: Normal appearance. She is well-developed. She is obese. She is not ill-appearing or diaphoretic.  HENT:     Head: Normocephalic and atraumatic.     Right Ear: Tympanic membrane, ear canal and external ear normal.     Left Ear: Tympanic membrane, ear canal and external ear normal.     Nose: Nose normal. No congestion.     Mouth/Throat:     Mouth: Mucous membranes are moist.     Pharynx: Oropharynx is clear. No posterior oropharyngeal erythema.  Eyes:     General: No scleral icterus.    Extraocular Movements: Extraocular movements intact.     Conjunctiva/sclera: Conjunctivae normal.     Pupils: Pupils are equal, round, and reactive to light.  Neck:     Thyroid: No thyromegaly.     Vascular: No carotid bruit or JVD.  Cardiovascular:     Rate and Rhythm: Normal rate and regular rhythm.     Pulses: Normal pulses.     Heart sounds: Normal heart sounds.     No gallop.  Pulmonary:     Effort: Pulmonary effort is normal. No respiratory distress.     Breath sounds: Normal breath sounds. No wheezing.     Comments: Good air exch Chest:     Chest wall: No tenderness.  Abdominal:     General: Bowel sounds are normal. There is no distension or abdominal bruit.     Palpations: Abdomen is soft. There is no mass.     Tenderness: There is no abdominal tenderness.     Hernia: No hernia is present.  Genitourinary:    Comments: Breast exam: No mass, nodules, thickening,  tenderness, bulging, retraction, inflamation, nipple discharge or skin changes noted.  No axillary or clavicular LA.     Musculoskeletal:        General: No tenderness. Normal range of motion.     Cervical back: Normal range of motion and neck supple. No rigidity. No muscular tenderness.     Right lower leg: No edema.     Left lower leg: No edema.     Comments: No kyphosis   Lymphadenopathy:     Cervical: No cervical adenopathy.  Skin:    General: Skin is warm and dry.     Coloration: Skin is not pale.     Findings: No erythema or rash.     Comments: Solar lentigines diffusely tanned  Neurological:     Mental Status: She is alert. Mental status is at baseline.     Cranial Nerves: No cranial nerve deficit.     Motor: No abnormal muscle tone.     Coordination: Coordination normal.     Gait: Gait normal.     Deep Tendon Reflexes: Reflexes are normal and symmetric. Reflexes normal.  Psychiatric:        Mood and Affect: Mood normal.        Cognition and Memory: Cognition and memory normal.           Assessment & Plan:   Problem List Items Addressed This Visit       Musculoskeletal and Integument   Osteoporosis    Doing well with alendronate since 07/2019 dexa rev 09/2020 Takes ca and D and some walking for exercise  No falls or fractures  Enc more wt bearing activity  Will be due dexa 09/2022      Relevant Orders   VITAMIN D 25 Hydroxy (Vit-D Deficiency, Fractures)     Other  Class 2 obesity due to excess calories with body mass index (BMI) of 36.0 to 36.9 in adult    Discussed how this problem influences overall health and the risks it imposes  Reviewed plan for weight loss with lower calorie diet (via better food choices and also portion control or program like weight watchers) and exercise building up to or more than 30 minutes 5 days per week including some aerobic activity   Set back after her friend died, now trying to get into a routine      Depression,  major, recurrent (HCC)    Some grief after loss of her best friend  Dealing well with good support  Does not desire counseling ref at this time Reviewed stressors/ coping techniques/symptoms/ support sources/ tx options and side effects in detail today  Enc good self care  Continue sertraline 100 mg daily      Elevated glucose    Lab Results  Component Value Date   HGBA1C 5.3 11/22/2020  disc imp of low glycemic diet and wt loss to prevent DM2       Relevant Orders   TSH   Lipid panel   Comprehensive metabolic panel   Encounter for screening mammogram for breast cancer    Ordered mammo for [place] Pt will call to schedule  Exam done  Enc self exams      Relevant Orders   MM 3D SCREEN BREAST BILATERAL   Medicare annual wellness visit, subsequent - Primary    Reviewed health habits including diet and exercise and skin cancer prevention Reviewed appropriate screening tests for age  Also reviewed health mt list, fam hx and immunization status , as well as social and family history   See HPI Labs ordered Colonoscopy utd Mammogram ordered/ pt will schedule Planning shingrix at pharmacy if affordable No falls or fx and dexa is utd , taking alendronate Taking ca and D Adv directive is utd No cognitive concerns  Hearing screen is normal and vision/eye care is utd phq score of 0 No help needed with ADLs Good functionality       PURE HYPERCHOLESTEROLEMIA   Relevant Orders   TSH   Lipid panel   Comprehensive metabolic panel   Routine general medical examination at a health care facility    Reviewed health habits including diet and exercise and skin cancer prevention Reviewed appropriate screening tests for age  Also reviewed health mt list, fam hx and immunization status , as well as social and family history   See HPI Labs ordered Colonoscopy utd Mammogram ordered/ pt will schedule Planning shingrix at pharmacy if affordable No falls or fx and dexa is utd , taking  alendronate Taking ca and D Adv directive is utd No cognitive concerns  Hearing screen is normal and vision/eye care is utd phq score of 0 No help needed with ADLs Good functionality         Relevant Orders   TSH   CBC with Differential/Platelet

## 2021-10-31 NOTE — Assessment & Plan Note (Signed)
Ordered mammo for [place] Pt will call to schedule  Exam done  Enc self exams

## 2021-10-31 NOTE — Assessment & Plan Note (Signed)
Reviewed health habits including diet and exercise and skin cancer prevention Reviewed appropriate screening tests for age  Also reviewed health mt list, fam hx and immunization status , as well as social and family history   See HPI Labs ordered Colonoscopy utd Mammogram ordered/ pt will schedule Planning shingrix at pharmacy if affordable No falls or fx and dexa is utd , taking alendronate Taking ca and D Adv directive is utd No cognitive concerns  Hearing screen is normal and vision/eye care is utd phq score of 0 No help needed with ADLs Good functionality

## 2021-11-01 ENCOUNTER — Telehealth: Payer: Self-pay

## 2021-11-25 ENCOUNTER — Ambulatory Visit
Admission: RE | Admit: 2021-11-25 | Discharge: 2021-11-25 | Disposition: A | Discharge status: Home / Self Care | Payer: Medicare PPO | Source: Ambulatory Visit | Attending: Family Medicine | Admitting: Family Medicine

## 2021-11-25 DIAGNOSIS — Z1231 Encounter for screening mammogram for malignant neoplasm of breast: Secondary | ICD-10-CM | POA: Diagnosis not present

## 2022-01-12 ENCOUNTER — Other Ambulatory Visit: Payer: Self-pay

## 2022-01-12 ENCOUNTER — Other Ambulatory Visit: Payer: Self-pay | Admitting: Family Medicine

## 2022-01-12 MED FILL — Alendronate Sodium Tab 70 MG: ORAL | 84 days supply | Qty: 12 | Fill #0 | Status: AC

## 2022-01-13 ENCOUNTER — Other Ambulatory Visit: Payer: Self-pay

## 2022-01-30 ENCOUNTER — Encounter: Payer: Self-pay | Admitting: Family Medicine

## 2022-02-02 ENCOUNTER — Ambulatory Visit: Payer: Medicare PPO | Admitting: Family Medicine

## 2022-02-02 ENCOUNTER — Encounter: Payer: Self-pay | Admitting: Family Medicine

## 2022-02-02 VITALS — BP 142/78 | HR 77 | Temp 97.6°F | Ht 64.0 in | Wt 226.2 lb

## 2022-02-02 DIAGNOSIS — R7309 Other abnormal glucose: Secondary | ICD-10-CM

## 2022-02-02 DIAGNOSIS — R0683 Snoring: Secondary | ICD-10-CM | POA: Insufficient documentation

## 2022-02-02 DIAGNOSIS — Z6838 Body mass index (BMI) 38.0-38.9, adult: Secondary | ICD-10-CM

## 2022-02-02 DIAGNOSIS — Z23 Encounter for immunization: Secondary | ICD-10-CM | POA: Diagnosis not present

## 2022-02-02 LAB — POCT GLYCOSYLATED HEMOGLOBIN (HGB A1C): Hemoglobin A1C: 5.4 % (ref 4.0–5.6)

## 2022-02-02 NOTE — Assessment & Plan Note (Signed)
Snoring but sleep quality seems good and no day time somnolence  Doubt sleep apnea based on that  She had sleep study yr ago-no apnea  If she wants eval for snoring will let us know  Obesity plays a role   Enc wt loss

## 2022-02-02 NOTE — Patient Instructions (Addendum)
Consider the [facility] program for weight management on line   If not - we should consider some counseling and I can refer her    Do your best to eat well  Stay as active as you can safely be  Pack food instead of eating out    I will send semaglutide to your pharmacy  We will do a prior auth  It it is covered we will start 0.25 mg weekly and have you follow up in a month    Let us know if you want a referral for sleep evaluation

## 2022-02-02 NOTE — Assessment & Plan Note (Signed)
Lab Results  Component Value Date   HGBA1C 5.4 02/02/2022   disc imp of low glycemic diet and wt loss to prevent DM2

## 2022-02-02 NOTE — Progress Notes (Signed)
Subjective:    Patient ID: Emma Knight, female    DOB: 27-Dec-1955, 66 y.o.   MRN: [id number]  HPI Pt presents for f/u of obesity   Wt Readings from Last 3 Encounters:  02/02/22 226 lb 4 oz (102.6 kg)  10/31/21 215 lb 3.2 oz (97.6 kg)  09/15/21 207 lb (93.9 kg)   38.84 kg/m  Lost a lot of wt in 2013  Gained it back Is struggling now and cannot get the weight off again   She is disappointed and feels badly about herself  Weight is hard on her physically  Makes her depressed   Age is an issues  Emotional eating is more of a problem than it was  Also eating when out of the house is very difficult   Has never done therapy     Lab Results  Component Value Date   HGBA1C 5.3 11/22/2020   Interested in semaglutide if it is covered  [employer] and they said they would   Disc option of GLP medication including possible side effects like GI intolerance and risk of thyroid and endocrine cancer, pancreatitis and gallstones, kidney problems and diabetic retinopathy  Lab Results  Component Value Date   CREATININE 0.57 10/31/2021   BUN 14 10/31/2021   NA 139 10/31/2021   K 5.0 10/31/2021   CL 103 10/31/2021   CO2 28 10/31/2021   Had a sleep study in the past  No apnea   Does not feel overly tired but does snore    Patient Active Problem List   Diagnosis Date Noted   Medicare annual wellness visit, subsequent 10/31/2021   Encounter for screening mammogram for breast cancer 10/31/2021   Acute systolic heart failure (HCC) 09/21/2020   Colon cancer screening 09/21/2020   Elevated glucose 09/21/2020   Welcome to Medicare preventive visit 07/23/2019   Encounter for hepatitis C screening test for low risk patient 03/22/2018   Encounter for screening for HIV 03/22/2018   Hydradenitis 05/14/2017   Depression, major, recurrent (HCC) 02/25/2016   Family history of leukemia 03/18/2014   Encounter for routine gynecological examination 09/20/2012   Routine general  medical examination at a health care facility 09/16/2012   Class 2 obesity due to excess calories with body mass index (BMI) of 38.0 to 38.9 in adult 12/30/2009   PURE HYPERCHOLESTEROLEMIA 03/03/2009   Osteoporosis 05/20/2008   Disorder of bone and cartilage 05/20/2008   Past Medical History:  Diagnosis Date   Hyperlipidemia    Osteopenia    Sleep apnea    History reviewed. No pertinent surgical history. Social History   Tobacco Use   Smoking status: Never   Smokeless tobacco: Never  Vaping Use   Vaping Use: Never used  Substance Use Topics   Alcohol use: Yes    Alcohol/week: 0.0 standard drinks of alcohol    Comment: rarely   Drug use: No   Family History  Problem Relation Age of Onset   Bladder Cancer Brother    Breast cancer Neg Hx    No Known Allergies Current Outpatient Medications on File Prior to Visit  Medication Sig Dispense Refill   alendronate (FOSAMAX) 70 MG tablet TAKE 1 TABLET BY MOUTH EVERY 7 DAYS. TAKE WITH A FULL GLASS OF WATER ON AN EMPTY STOMACH. 12 tablet 2   sertraline (ZOLOFT) 100 MG tablet TAKE 1 TABLET BY MOUTH AT BEDTIME 90 tablet 3   No current facility-administered medications on file prior to visit.    Review  of Systems  Constitutional:  Positive for appetite change. Negative for activity change, fatigue, fever and unexpected weight change.  HENT:  Negative for congestion, ear pain, rhinorrhea, sinus pressure and sore throat.   Eyes:  Negative for pain, redness and visual disturbance.  Respiratory:  Negative for cough, shortness of breath and wheezing.   Cardiovascular:  Negative for chest pain and palpitations.  Gastrointestinal:  Negative for abdominal pain, blood in stool, constipation and diarrhea.  Endocrine: Negative for polydipsia and polyuria.  Genitourinary:  Negative for dysuria, frequency and urgency.  Musculoskeletal:  Negative for arthralgias, back pain and myalgias.  Skin:  Negative for pallor and rash.  Allergic/Immunologic:  Negative for environmental allergies.  Neurological:  Negative for dizziness, syncope and headaches.  Hematological:  Negative for adenopathy. Does not bruise/bleed easily.  Psychiatric/Behavioral:  Negative for decreased concentration and dysphoric mood. The patient is not nervous/anxious.        Objective:   Physical Exam Constitutional:      General: She is not in acute distress.    Appearance: Normal appearance. She is well-developed. She is obese. She is not ill-appearing or diaphoretic.  HENT:     Head: Normocephalic and atraumatic.     Mouth/Throat:     Mouth: Mucous membranes are moist.  Eyes:     General: No scleral icterus.    Conjunctiva/sclera: Conjunctivae normal.     Pupils: Pupils are equal, round, and reactive to light.  Neck:     Thyroid: No thyromegaly.     Vascular: No carotid bruit or JVD.  Cardiovascular:     Rate and Rhythm: Normal rate and regular rhythm.     Heart sounds: Normal heart sounds.     No gallop.  Pulmonary:     Effort: Pulmonary effort is normal. No respiratory distress.     Breath sounds: Normal breath sounds. No stridor. No wheezing, rhonchi or rales.  Abdominal:     General: There is no distension or abdominal bruit.     Palpations: Abdomen is soft.  Musculoskeletal:     Cervical back: Normal range of motion and neck supple.     Right lower leg: No edema.     Left lower leg: No edema.  Lymphadenopathy:     Cervical: No cervical adenopathy.  Skin:    General: Skin is warm and dry.     Coloration: Skin is not pale.     Findings: No bruising or rash.  Neurological:     Mental Status: She is alert.     Cranial Nerves: No cranial nerve deficit.     Coordination: Coordination normal.     Deep Tendon Reflexes: Reflexes are normal and symmetric. Reflexes normal.  Psychiatric:        Mood and Affect: Mood normal.           Assessment & Plan:   Problem List Items Addressed This Visit       Other   Class 2 obesity due to  excess calories with body mass index (BMI) of 38.0 to 38.9 in adult - Primary    Discussed how this problem influences overall health and the risks it imposes  Reviewed plan for weight loss with lower calorie diet (via better food choices and also portion control or program like weight watchers) and exercise building up to or more than 30 minutes 5 days per week including some aerobic activity   Pt has some issues with body image/weight and seems to emotionally punish herself for  this I feel she would benefit from counseling (either private or with the [facility] program)   She is interested in semaglutide as well to help more  Px sent to her pharmacy-will do prior auth  Unsure if ins will pay for wt loss medication   Disc option of GLP medication including possible side effects like GI intolerance and risk of thyroid and endocrine cancer, pancreatitis and gallstones, kidney problems and diabetic retinopathy If she starts it will plan f/u in 1 month        Elevated glucose    Lab Results  Component Value Date   HGBA1C 5.4 02/02/2022  disc imp of low glycemic diet and wt loss to prevent DM2       Relevant Orders   POCT glycosylated hemoglobin (Hb A1C) (Completed)   Snoring    Snoring but sleep quality seems good and no day time somnolence  Doubt sleep apnea based on that  She had sleep study yr ago-no apnea  If she wants eval for snoring will let us know  Obesity plays a role   Enc wt loss       Other Visit Diagnoses     Need for influenza vaccination       Relevant Orders   Flu Vaccine QUAD High Dose(Fluad) (Completed)

## 2022-02-02 NOTE — Assessment & Plan Note (Signed)
Discussed how this problem influences overall health and the risks it imposes  Reviewed plan for weight loss with lower calorie diet (via better food choices and also portion control or program like weight watchers) and exercise building up to or more than 30 minutes 5 days per week including some aerobic activity   Pt has some issues with body image/weight and seems to emotionally punish herself for this I feel she would benefit from counseling (either private or with the [place] program)   She is interested in semaglutide as well to help more  Px sent to her pharmacy-will do prior auth  Unsure if ins will pay for wt loss medication   Disc option of GLP medication including possible side effects like GI intolerance and risk of thyroid and endocrine cancer, pancreatitis and gallstones, kidney problems and diabetic retinopathy If she starts it will plan f/u in 1 month

## 2022-02-17 ENCOUNTER — Other Ambulatory Visit: Payer: Self-pay

## 2022-02-17 DIAGNOSIS — H16142 Punctate keratitis, left eye: Secondary | ICD-10-CM | POA: Diagnosis not present

## 2022-02-17 MED ORDER — TOBRAMYCIN-DEXAMETHASONE 0.3-0.1 % OP SUSP
OPHTHALMIC | 0 refills | Status: DC
  Filled 2022-02-17: qty 5, 15d supply, fill #0

## 2022-04-06 ENCOUNTER — Other Ambulatory Visit: Payer: Self-pay

## 2022-04-06 MED ORDER — COVID-19 MRNA VAC-TRIS(PFIZER) 30 MCG/0.3ML IM SUSY
PREFILLED_SYRINGE | INTRAMUSCULAR | 0 refills | Status: DC
  Filled 2022-04-11: qty 0.3, 1d supply, fill #0

## 2022-04-06 MED FILL — Alendronate Sodium Tab 70 MG: ORAL | 84 days supply | Qty: 12 | Fill #1 | Status: AC

## 2022-04-11 ENCOUNTER — Other Ambulatory Visit: Payer: Self-pay

## 2022-05-22 ENCOUNTER — Other Ambulatory Visit: Payer: Self-pay

## 2022-05-22 MED FILL — Alendronate Sodium Tab 70 MG: ORAL | 84 days supply | Qty: 12 | Fill #2 | Status: CN

## 2022-06-12 ENCOUNTER — Encounter: Payer: Self-pay | Admitting: Family Medicine

## 2022-06-21 DIAGNOSIS — D225 Melanocytic nevi of trunk: Secondary | ICD-10-CM | POA: Diagnosis not present

## 2022-06-21 DIAGNOSIS — L821 Other seborrheic keratosis: Secondary | ICD-10-CM | POA: Diagnosis not present

## 2022-06-21 DIAGNOSIS — L814 Other melanin hyperpigmentation: Secondary | ICD-10-CM | POA: Diagnosis not present

## 2022-06-21 DIAGNOSIS — D2262 Melanocytic nevi of left upper limb, including shoulder: Secondary | ICD-10-CM | POA: Diagnosis not present

## 2022-06-21 DIAGNOSIS — D2271 Melanocytic nevi of right lower limb, including hip: Secondary | ICD-10-CM | POA: Diagnosis not present

## 2022-06-21 DIAGNOSIS — D2261 Melanocytic nevi of right upper limb, including shoulder: Secondary | ICD-10-CM | POA: Diagnosis not present

## 2022-06-21 DIAGNOSIS — D2272 Melanocytic nevi of left lower limb, including hip: Secondary | ICD-10-CM | POA: Diagnosis not present

## 2022-06-30 ENCOUNTER — Telehealth: Payer: Self-pay | Admitting: *Deleted

## 2022-06-30 DIAGNOSIS — R0683 Snoring: Secondary | ICD-10-CM

## 2022-06-30 NOTE — Telephone Encounter (Signed)
I put the referral in  Please let us know if you don't hear in 1-2 weeks

## 2022-06-30 NOTE — Telephone Encounter (Signed)
Pt responded to PCP's mychart message saying:   I would like a referral to Dr. Ileene Hutchinson office for a sleep study evaluation. Thank you, Emma Knight

## 2022-06-30 NOTE — Telephone Encounter (Signed)
Sent mychart letting pt know 

## 2022-07-03 ENCOUNTER — Encounter: Payer: Self-pay | Admitting: *Deleted

## 2022-07-25 DIAGNOSIS — J342 Deviated nasal septum: Secondary | ICD-10-CM | POA: Diagnosis not present

## 2022-07-25 DIAGNOSIS — R0683 Snoring: Secondary | ICD-10-CM | POA: Diagnosis not present

## 2022-08-24 ENCOUNTER — Telehealth: Payer: Self-pay | Admitting: Family Medicine

## 2022-08-24 NOTE — Telephone Encounter (Signed)
Contacted Emma Knight to schedule their annuaAndee Knight. Appointment made for 09/06/2022.  [facility] Care Guide [facility] AWV TEAM Direct Dial: [phone]

## 2022-09-06 ENCOUNTER — Ambulatory Visit (INDEPENDENT_AMBULATORY_CARE_PROVIDER_SITE_OTHER): Payer: Medicare PPO

## 2022-09-06 VITALS — Ht 64.0 in | Wt 199.0 lb

## 2022-09-06 DIAGNOSIS — Z780 Asymptomatic menopausal state: Secondary | ICD-10-CM | POA: Diagnosis not present

## 2022-09-06 DIAGNOSIS — Z0000 Encounter for general adult medical examination without abnormal findings: Secondary | ICD-10-CM

## 2022-09-06 NOTE — Progress Notes (Signed)
Subjective:   PRESLEE REGAS is a 67 y.o. female who presents for Medicare Annual (Subsequent) preventive examination.  I connected with  Andee Poles on 09/06/22 by a audio enabled telemedicine application and verified that I am speaking with the correct person using two identifiers.  Patient Location: Skilled Nursing Facility  Provider Location: Home Office  I discussed the limitations of evaluation and management by telemedicine. The patient expressed understanding and agreed to proceed.     Review of Systems     Cardiac Risk Factors include: advanced age (>20men, >25 women)     Objective:    Today's Vitals   09/06/22 1028  Weight: 199 lb (90.3 kg)  Height: 5\' 4"  (1.626 m)   Body mass index is 34.16 kg/m.     09/06/2022   10:33 AM 09/15/2021   11:02 AM 09/14/2020   11:23 AM  Advanced Directives  Does Patient Have a Medical Advance Directive? Yes No Yes  Type of Estate agent of [place];Living will  Healthcare Power of [place];Living will  Copy of Healthcare Power of Attorney in Chart? No - copy requested  No - copy requested  Would patient like information on creating a medical advance directive?  No - Patient declined     Current Medications (verified) Outpatient Encounter Medications as of 09/06/2022  Medication Sig   alendronate (FOSAMAX) 70 MG tablet TAKE 1 TABLET BY MOUTH EVERY 7 DAYS. TAKE WITH A FULL GLASS OF WATER ON AN EMPTY STOMACH.   sertraline (ZOLOFT) 100 MG tablet TAKE 1 TABLET BY MOUTH AT BEDTIME   COVID-19 mRNA vaccine 2023-2024 (COMIRNATY) syringe Inject into the muscle.   tobramycin-dexamethasone (TOBRADEX) ophthalmic solution Location: Left Eye. Apply 1 drop OS TID x 5 days, 1 drop BID x 5 days, 1 drop once a day x 5 days   No facility-administered encounter medications on file as of 09/06/2022.    Allergies (verified) Patient has no known allergies.   History: Past Medical History:  Diagnosis Date   Hyperlipidemia     Osteopenia    Sleep apnea    History reviewed. No pertinent surgical history. Family History  Problem Relation Age of Onset   Bladder Cancer Brother    Breast cancer Neg Hx    Social History   Socioeconomic History   Marital status: Divorced    Spouse name: Not on file   Number of children: Not on file   Years of education: Not on file   Highest education level: Not on file  Occupational History   Not on file  Tobacco Use   Smoking status: Never   Smokeless tobacco: Never  Vaping Use   Vaping Use: Never used  Substance and Sexual Activity   Alcohol use: Yes    Alcohol/week: 0.0 standard drinks of alcohol    Comment: rarely   Drug use: No   Sexual activity: Not on file  Other Topics Concern   Not on file  Social History Narrative   Not on file   Social Determinants of Health   Financial Resource Strain: Low Risk  (09/06/2022)   Overall Financial Resource Strain (CARDIA)    Difficulty of Paying Living Expenses: Not hard at all  Food Insecurity: No Food Insecurity (09/06/2022)   Hunger Vital Sign    Worried About Running Out of Food in the Last Year: Never true    Ran Out of Food in the Last Year: Never true  Transportation Needs: No Transportation Needs (09/06/2022)  PRAPARE - Administrator, Civil Service (Medical): No    Lack of Transportation (Non-Medical): No  Physical Activity: Insufficiently Active (09/06/2022)   Exercise Vital Sign    Days of Exercise per Week: 1 day    Minutes of Exercise per Session: 30 min  Stress: No Stress Concern Present (09/06/2022)   [employer] of Occupational Health - Occupational Stress Questionnaire    Feeling of Stress : Not at all  Social Connections: Moderately Integrated (09/06/2022)   Social Connection and Isolation Panel [NHANES]    Frequency of Communication with Friends and Family: Twice a week    Frequency of Social Gatherings with Friends and Family: Once a week    Attends Religious Services: More than 4  times per year    Active Member of [employer] or Organizations: Yes    Attends Engineer, structural: More than 4 times per year    Marital Status: Divorced    Tobacco Counseling Counseling given: Not Answered   Clinical Intake:  Pre-visit preparation completed: Yes  Pain : No/denies pain     BMI - recorded: 35.19 Nutritional Status: BMI > 30  Obese Nutritional Risks: None Diabetes: No  How often do you need to have someone help you when you read instructions, pamphlets, or other written materials from your doctor or pharmacy?: 1 - Never  Diabetic?  No  Interpreter Needed?: No  Information entered by :: Kandis Cocking, CMA   Activities of Daily Living    09/06/2022   10:34 AM 09/04/2022   12:54 AM  In your present state of health, do you have any difficulty performing the following activities:  Hearing? 0 0  Vision? 0 0  Difficulty concentrating or making decisions? 0 0  Walking or climbing stairs? 0 0  Dressing or bathing? 0 0  Doing errands, shopping? 0 0  Preparing Food and eating ? N N  Using the Toilet? N N  In the past six months, have you accidently leaked urine? N N  Do you have problems with loss of bowel control? N N  Managing your Medications? N N  Managing your Finances? N N  Housekeeping or managing your Housekeeping? N N    Patient Care Team: Tower, Audrie Gallus, MD as PCP - General  Indicate any recent Medical Services you may have received from other than [facility] providers in the past year (date may be approximate).     Assessment:   This is a routine wellness examination for Esterlene.  Hearing/Vision screen Hearing Screening - Comments:: Denies hearing difficulties   Vision Screening - Comments:: Wears rx glasses - up to date with routine eye exams with  2023 with Dr. Clydene Pugh in [place]  Dietary issues and exercise activities discussed: Current Exercise Habits: The patient does not participate in regular exercise at present (Patient does  yardwork)   Goals Addressed             This Visit's Progress    Patient Stated       Lose weight, exercise more       Depression Screen    09/06/2022   10:39 AM 10/31/2021    9:58 AM 09/15/2021   11:01 AM 09/14/2020   11:27 AM 07/23/2019    2:01 PM 03/22/2018   12:26 PM 05/14/2017    9:30 AM  PHQ 2/9 Scores  PHQ - 2 Score 0 0 0 0 0 0 0  PHQ- 9 Score    0  0 0  Fall Risk    09/06/2022   10:32 AM 09/04/2022   12:54 AM 10/31/2021    9:58 AM 09/15/2021   11:03 AM 09/14/2020   11:26 AM  Fall Risk   Falls in the past year? 0 0 0 0 0  Number falls in past yr: 0   0 0  Injury with Fall? 0   0 0  Risk for fall due to : No Fall Risks   No Fall Risks No Fall Risks  Follow up Falls prevention discussed  Falls evaluation completed Falls evaluation completed Falls evaluation completed;Falls prevention discussed    FALL RISK PREVENTION PERTAINING TO THE HOME:  Any stairs in or around the home? No  If so, are there any without handrails? No  Home free of loose throw rugs in walkways, pet beds, electrical cords, etc? No  Adequate lighting in your home to reduce risk of falls? Yes   ASSISTIVE DEVICES UTILIZED TO PREVENT FALLS:  Life alert? No  Use of a cane, walker or w/c? No  Grab bars in the bathroom? No  Shower chair or bench in shower? Yes  Elevated toilet seat or a handicapped toilet? No   TIMED UP AND GO:  Was the test performed? No . Televisit  Cognitive Function:    09/14/2020   11:28 AM  MMSE - Mini Mental State Exam  Not completed: Refused        09/06/2022   10:41 AM 09/15/2021   11:05 AM  6CIT Screen  What Year? 0 points 0 points  What month? 0 points 0 points  What time? 0 points 0 points  Count back from 20 0 points 0 points  Months in reverse 0 points 0 points  Repeat phrase 2 points 0 points  Total Score 2 points 0 points    Immunizations Immunization History  Administered Date(s) Administered   COVID-19, mRNA, vaccine(Comirnaty)12 years and  older 04/11/2022   Fluad Quad(high Dose 65+) 02/26/2021, 02/02/2022   Influenza Inj Mdck Quad With Preservative 02/23/2018   Influenza Whole 01/25/2009, 02/26/2021   Influenza,inj,Quad PF,6+ Mos 03/06/2014, 02/25/2016   Influenza-Unspecified 01/21/2015, 02/04/2017, 01/21/2019   PFIZER(Purple Top)SARS-COV-2 Vaccination 05/20/2019, 06/10/2019, 02/27/2020   Pneumococcal Conjugate-13 07/23/2019   Pneumococcal Polysaccharide-23 09/21/2020   Td 05/09/2003   Tdap 03/18/2014    Tdap-Up to date  Flu Vaccine status: Up to date  Pneumococcal vaccine status: Up to date  Covid-19 vaccine status: Completed vaccines  Qualifies for Shingles Vaccine? Yes   Zostavax completed No   Shingrix Completed?: No.    Education has been provided regarding the importance of this vaccine. Patient has been advised to call insurance company to determine out of pocket expense if they have not yet received this vaccine. Advised may also receive vaccine at local pharmacy or Health Dept. Verbalized acceptance and understanding.  Screening Tests Health Maintenance  Topic Date Due   Zoster Vaccines- Shingrix (1 of 2) Never done   DEXA SCAN  09/23/2022   MAMMOGRAM  11/26/2022   INFLUENZA VACCINE  12/07/2022   Medicare Annual Wellness (AWV)  09/06/2023   DTaP/Tdap/Td (3 - Td or Tdap) 03/18/2024   COLONOSCOPY (Pts 45-71yrs Insurance coverage will need to be confirmed)  06/03/2028   Pneumonia Vaccine 51+ Years old  Completed   COVID-19 Vaccine  Completed   Hepatitis C Screening  Completed   HPV VACCINES  Aged Out    Health Maintenance  Health Maintenance Due  Topic Date Due   Zoster Vaccines- Shingrix (1 of  2) Never done    Colorectal cancer screening: Type of screening: Colonoscopy. Completed 06/03/2018  . Repeat every 10 years  Mammogram status: Completed 11/25/2021  . Repeat every year  Bone Density status: Ordered  . Pt provided with contact info and advised to call to schedule appt.  Lung Cancer  Screening: (Low Dose CT Chest recommended if Age 74-80 years, 30 pack-year currently smoking OR have quit w/in 15years.) does not qualify.   Lung Cancer Screening Referral: N/A  Additional Screening:  Hepatitis C Screening: does qualify; Completed   Vision Screening: Recommended annual ophthalmology exams for early detection of glaucoma and other disorders of the eye.  Is the patient up to date with their annual eye exam?  Yes   Who is the provider or what is the name of the office in which the patient attends annual eye exams?    Dr. Clydene Pugh in [place] in 2023  If pt is not established with a provider, would they like to be referred to a provider to establish care? No .   Dental Screening: Recommended annual dental exams for proper oral hygiene  Community Resource Referral / Chronic Care Management: CRR required this visit?  No   CCM required this visit?  No      Plan:     I have personally reviewed and noted the following in the patient's chart:   Medical and social history Use of alcohol, tobacco or illicit drugs  Current medications and supplements including opioid prescriptions. Patient is not currently taking opioid prescriptions. Functional ability and status Nutritional status Physical activity Advanced directives List of other physicians Hospitalizations, surgeries, and ER visits in previous 12 months Vitals Screenings to include cognitive, depression, and falls Referrals and appointments  In addition, I have reviewed and discussed with patient certain preventive protocols, quality metrics, and best practice recommendations. A written personalized care plan for preventive services as well as general preventive health recommendations were provided to patient.     Milus Mallick, CMA   09/06/2022   Nurse Notes:  Dexa order placed.

## 2022-09-06 NOTE — Patient Instructions (Signed)
Emma Knight , Thank you for taking time to come for your Medicare Wellness Visit. I appreciate your ongoing commitment to your health goals. Please review the following plan we discussed and let me know if I can assist you in the future.   These are the goals we discussed:  Goals      DIET - EAT MORE FRUITS AND VEGETABLES     Patient Stated     09/14/2020, I will maintain and continue medications as prescribed.     Patient Stated     Lose weight, exercise more        This is a list of the screening recommended for you and due dates:  Health Maintenance  Topic Date Due   Zoster (Shingles) Vaccine (1 of 2) Never done   DEXA scan (bone density measurement)  09/23/2022   Mammogram  11/26/2022   Flu Shot  12/07/2022   Medicare Annual Wellness Visit  09/06/2023   DTaP/Tdap/Td vaccine (3 - Td or Tdap) 03/18/2024   Colon Cancer Screening  06/03/2028   Pneumonia Vaccine  Completed   COVID-19 Vaccine  Completed   Hepatitis C Screening: USPSTF Recommendation to screen - Ages 74-79 yo.  Completed   HPV Vaccine  Aged Out    Advanced directives:  Discussed patient to bring copy of living will   Conditions/risks identified: Keep up the good work  Next appointment: Follow up in one year for your annual wellness visit 09/11/2023   Preventive Care 65 Years and Older, Female Preventive care refers to lifestyle choices and visits with your health care provider that can promote health and wellness. What does preventive care include? A yearly physical exam. This is also called an annual well check. Dental exams once or twice a year. Routine eye exams. Ask your health care provider how often you should have your eyes checked. Personal lifestyle choices, including: Daily care of your teeth and gums. Regular physical activity. Eating a healthy diet. Avoiding tobacco and drug use. Limiting alcohol use. Practicing safe sex. Taking low-dose aspirin every day. Taking vitamin and mineral  supplements as recommended by your health care provider. What happens during an annual well check? The services and screenings done by your health care provider during your annual well check will depend on your age, overall health, lifestyle risk factors, and family history of disease. Counseling  Your health care provider may ask you questions about your: Alcohol use. Tobacco use. Drug use. Emotional well-being. Home and relationship well-being. Sexual activity. Eating habits. History of falls. Memory and ability to understand (cognition). Work and work Astronomer. Reproductive health. Screening  You may have the following tests or measurements: Height, weight, and BMI. Blood pressure. Lipid and cholesterol levels. These may be checked every 5 years, or more frequently if you are over 18 years old. Skin check. Lung cancer screening. You may have this screening every year starting at age 46 if you have a 30-pack-year history of smoking and currently smoke or have quit within the past 15 years. Fecal occult blood test (FOBT) of the stool. You may have this test every year starting at age 26. Flexible sigmoidoscopy or colonoscopy. You may have a sigmoidoscopy every 5 years or a colonoscopy every 10 years starting at age 78. Hepatitis C blood test. Hepatitis B blood test. Sexually transmitted disease (STD) testing. Diabetes screening. This is done by checking your blood sugar (glucose) after you have not eaten for a while (fasting). You may have this done every 1-3 years.  Bone density scan. This is done to screen for osteoporosis. You may have this done starting at age 33. Mammogram. This may be done every 1-2 years. Talk to your health care provider about how often you should have regular mammograms. Talk with your health care provider about your test results, treatment options, and if necessary, the need for more tests. Vaccines  Your health care provider may recommend certain  vaccines, such as: Influenza vaccine. This is recommended every year. Tetanus, diphtheria, and acellular pertussis (Tdap, Td) vaccine. You may need a Td booster every 10 years. Zoster vaccine. You may need this after age 14. Pneumococcal 13-valent conjugate (PCV13) vaccine. One dose is recommended after age 38. Pneumococcal polysaccharide (PPSV23) vaccine. One dose is recommended after age 64. Talk to your health care provider about which screenings and vaccines you need and how often you need them. This information is not intended to replace advice given to you by your health care provider. Make sure you discuss any questions you have with your health care provider. Document Released: 05/21/2015 Document Revised: 01/12/2016 Document Reviewed: 02/23/2015 Elsevier Interactive Patient Education  2017 [place] Prevention in the Home Falls can cause injuries. They can happen to people of all ages. There are many things you can do to make your home safe and to help prevent falls. What can I do on the outside of my home? Regularly fix the edges of walkways and driveways and fix any cracks. Remove anything that might make you trip as you walk through a door, such as a raised step or threshold. Trim any bushes or trees on the path to your home. Use bright outdoor lighting. Clear any walking paths of anything that might make someone trip, such as rocks or tools. Regularly check to see if handrails are loose or broken. Make sure that both sides of any steps have handrails. Any raised decks and porches should have guardrails on the edges. Have any leaves, snow, or ice cleared regularly. Use sand or salt on walking paths during winter. Clean up any spills in your garage right away. This includes oil or grease spills. What can I do in the bathroom? Use night lights. Install grab bars by the toilet and in the tub and shower. Do not use towel bars as grab bars. Use non-skid mats or decals in  the tub or shower. If you need to sit down in the shower, use a plastic, non-slip stool. Keep the floor dry. Clean up any water that spills on the floor as soon as it happens. Remove soap buildup in the tub or shower regularly. Attach bath mats securely with double-sided non-slip rug tape. Do not have throw rugs and other things on the floor that can make you trip. What can I do in the bedroom? Use night lights. Make sure that you have a light by your bed that is easy to reach. Do not use any sheets or blankets that are too big for your bed. They should not hang down onto the floor. Have a firm chair that has side arms. You can use this for support while you get dressed. Do not have throw rugs and other things on the floor that can make you trip. What can I do in the kitchen? Clean up any spills right away. Avoid walking on wet floors. Keep items that you use a lot in easy-to-reach places. If you need to reach something above you, use a strong step stool that has a grab bar.  Keep electrical cords out of the way. Do not use floor polish or wax that makes floors slippery. If you must use wax, use non-skid floor wax. Do not have throw rugs and other things on the floor that can make you trip. What can I do with my stairs? Do not leave any items on the stairs. Make sure that there are handrails on both sides of the stairs and use them. Fix handrails that are broken or loose. Make sure that handrails are as long as the stairways. Check any carpeting to make sure that it is firmly attached to the stairs. Fix any carpet that is loose or worn. Avoid having throw rugs at the top or bottom of the stairs. If you do have throw rugs, attach them to the floor with carpet tape. Make sure that you have a light switch at the top of the stairs and the bottom of the stairs. If you do not have them, ask someone to add them for you. What else can I do to help prevent falls? Wear shoes that: Do not have high  heels. Have rubber bottoms. Are comfortable and fit you well. Are closed at the toe. Do not wear sandals. If you use a stepladder: Make sure that it is fully opened. Do not climb a closed stepladder. Make sure that both sides of the stepladder are locked into place. Ask someone to hold it for you, if possible. Clearly mark and make sure that you can see: Any grab bars or handrails. First and last steps. Where the edge of each step is. Use tools that help you move around (mobility aids) if they are needed. These include: Canes. Walkers. Scooters. Crutches. Turn on the lights when you go into a dark area. Replace any light bulbs as soon as they burn out. Set up your furniture so you have a clear path. Avoid moving your furniture around. If any of your floors are uneven, fix them. If there are any pets around you, be aware of where they are. Review your medicines with your doctor. Some medicines can make you feel dizzy. This can increase your chance of falling. Ask your doctor what other things that you can do to help prevent falls. This information is not intended to replace advice given to you by your health care provider. Make sure you discuss any questions you have with your health care provider. Document Released: 02/18/2009 Document Revised: 09/30/2015 Document Reviewed: 05/29/2014 Elsevier Interactive Patient Education  2017 [employer].

## 2022-10-10 ENCOUNTER — Other Ambulatory Visit: Payer: Self-pay

## 2022-10-10 ENCOUNTER — Other Ambulatory Visit: Payer: Self-pay | Admitting: Family Medicine

## 2022-10-10 MED ORDER — SERTRALINE HCL 100 MG PO TABS
100.0000 mg | ORAL_TABLET | Freq: Every day | ORAL | 0 refills | Status: DC
  Filled 2022-10-10: qty 90, 90d supply, fill #0

## 2022-10-10 MED FILL — Alendronate Sodium Tab 70 MG: ORAL | 84 days supply | Qty: 12 | Fill #2 | Status: AC

## 2022-10-10 NOTE — Telephone Encounter (Signed)
Patient scheduled.

## 2022-10-10 NOTE — Telephone Encounter (Signed)
Pt is due for CPE with Dr Milinda Antis. Please help her get scheduled. Thank you.

## 2022-10-31 ENCOUNTER — Other Ambulatory Visit: Payer: Self-pay

## 2022-10-31 DIAGNOSIS — Z131 Encounter for screening for diabetes mellitus: Secondary | ICD-10-CM | POA: Diagnosis not present

## 2022-10-31 DIAGNOSIS — R635 Abnormal weight gain: Secondary | ICD-10-CM | POA: Diagnosis not present

## 2022-10-31 DIAGNOSIS — R0602 Shortness of breath: Secondary | ICD-10-CM | POA: Diagnosis not present

## 2022-10-31 DIAGNOSIS — R5383 Other fatigue: Secondary | ICD-10-CM | POA: Diagnosis not present

## 2022-10-31 DIAGNOSIS — E6609 Other obesity due to excess calories: Secondary | ICD-10-CM | POA: Diagnosis not present

## 2022-10-31 DIAGNOSIS — R030 Elevated blood-pressure reading, without diagnosis of hypertension: Secondary | ICD-10-CM | POA: Diagnosis not present

## 2022-10-31 DIAGNOSIS — Z6838 Body mass index (BMI) 38.0-38.9, adult: Secondary | ICD-10-CM | POA: Diagnosis not present

## 2022-10-31 DIAGNOSIS — E559 Vitamin D deficiency, unspecified: Secondary | ICD-10-CM | POA: Diagnosis not present

## 2022-10-31 DIAGNOSIS — R9431 Abnormal electrocardiogram [ECG] [EKG]: Secondary | ICD-10-CM | POA: Diagnosis not present

## 2022-10-31 MED ORDER — WEGOVY 0.25 MG/0.5ML ~~LOC~~ SOAJ
0.2500 mg | SUBCUTANEOUS | 0 refills | Status: DC
  Filled 2022-10-31: qty 2, 28d supply, fill #0

## 2022-11-10 ENCOUNTER — Other Ambulatory Visit: Payer: Self-pay | Admitting: Family Medicine

## 2022-11-10 DIAGNOSIS — Z1231 Encounter for screening mammogram for malignant neoplasm of breast: Secondary | ICD-10-CM

## 2022-11-16 DIAGNOSIS — G4733 Obstructive sleep apnea (adult) (pediatric): Secondary | ICD-10-CM | POA: Diagnosis not present

## 2022-11-17 ENCOUNTER — Ambulatory Visit
Admission: RE | Admit: 2022-11-17 | Discharge: 2022-11-17 | Disposition: A | Discharge status: Home / Self Care | Payer: Medicare PPO | Source: Ambulatory Visit | Attending: Family Medicine | Admitting: Family Medicine

## 2022-11-17 DIAGNOSIS — Z780 Asymptomatic menopausal state: Secondary | ICD-10-CM | POA: Diagnosis not present

## 2022-11-17 DIAGNOSIS — M810 Age-related osteoporosis without current pathological fracture: Secondary | ICD-10-CM | POA: Diagnosis not present

## 2022-11-19 ENCOUNTER — Telehealth: Payer: Self-pay | Admitting: Family Medicine

## 2022-11-19 DIAGNOSIS — M810 Age-related osteoporosis without current pathological fracture: Secondary | ICD-10-CM

## 2022-11-19 DIAGNOSIS — I5021 Acute systolic (congestive) heart failure: Secondary | ICD-10-CM

## 2022-11-19 DIAGNOSIS — Z806 Family history of leukemia: Secondary | ICD-10-CM

## 2022-11-19 DIAGNOSIS — R7309 Other abnormal glucose: Secondary | ICD-10-CM

## 2022-11-19 DIAGNOSIS — E7800 Pure hypercholesterolemia, unspecified: Secondary | ICD-10-CM

## 2022-11-19 NOTE — Telephone Encounter (Signed)
-----   Message from Lovena Neighbours sent at 11/07/2022 10:16 AM EDT ----- Regarding: Labs for 7.15.24 Patient is scheduled for CPX labs, please order future labs. Thank you, Denny Peon

## 2022-11-20 ENCOUNTER — Other Ambulatory Visit: Payer: Medicare PPO

## 2022-11-20 DIAGNOSIS — Z1231 Encounter for screening mammogram for malignant neoplasm of breast: Secondary | ICD-10-CM | POA: Diagnosis present

## 2022-11-20 DIAGNOSIS — E7800 Pure hypercholesterolemia, unspecified: Secondary | ICD-10-CM | POA: Diagnosis not present

## 2022-11-20 DIAGNOSIS — R7309 Other abnormal glucose: Secondary | ICD-10-CM | POA: Diagnosis not present

## 2022-11-20 DIAGNOSIS — R69 Illness, unspecified: Secondary | ICD-10-CM | POA: Diagnosis not present

## 2022-11-20 DIAGNOSIS — G4733 Obstructive sleep apnea (adult) (pediatric): Secondary | ICD-10-CM | POA: Diagnosis not present

## 2022-11-20 DIAGNOSIS — Z806 Family history of leukemia: Secondary | ICD-10-CM

## 2022-11-20 DIAGNOSIS — F330 Major depressive disorder, recurrent, mild: Secondary | ICD-10-CM | POA: Diagnosis not present

## 2022-11-20 DIAGNOSIS — R0683 Snoring: Secondary | ICD-10-CM | POA: Diagnosis not present

## 2022-11-20 DIAGNOSIS — Z0000 Encounter for general adult medical examination without abnormal findings: Secondary | ICD-10-CM | POA: Diagnosis not present

## 2022-11-20 DIAGNOSIS — M810 Age-related osteoporosis without current pathological fracture: Secondary | ICD-10-CM

## 2022-11-20 DIAGNOSIS — Z6838 Body mass index (BMI) 38.0-38.9, adult: Secondary | ICD-10-CM | POA: Diagnosis not present

## 2022-11-20 LAB — CBC WITH DIFFERENTIAL/PLATELET
Basophils Absolute: 0 10*3/uL (ref 0.0–0.1)
Basophils Relative: 0.6 % (ref 0.0–3.0)
Eosinophils Absolute: 0 10*3/uL (ref 0.0–0.7)
Eosinophils Relative: 1 % (ref 0.0–5.0)
HCT: 36.9 % (ref 36.0–46.0)
Hemoglobin: 12.4 g/dL (ref 12.0–15.0)
Lymphocytes Relative: 36.5 % (ref 12.0–46.0)
Lymphs Abs: 1.6 10*3/uL (ref 0.7–4.0)
MCHC: 33.6 g/dL (ref 30.0–36.0)
MCV: 94.4 fl (ref 78.0–100.0)
Monocytes Absolute: 0.3 10*3/uL (ref 0.1–1.0)
Monocytes Relative: 7.9 % (ref 3.0–12.0)
Neutro Abs: 2.3 10*3/uL (ref 1.4–7.7)
Neutrophils Relative %: 54 % (ref 43.0–77.0)
Platelets: 188 10*3/uL (ref 150.0–400.0)
RBC: 3.91 Mil/uL (ref 3.87–5.11)
RDW: 13.7 % (ref 11.5–15.5)
WBC: 4.3 10*3/uL (ref 4.0–10.5)

## 2022-11-20 LAB — LIPID PANEL
Cholesterol: 228 mg/dL — ABNORMAL HIGH (ref 0–200)
HDL: 71.1 mg/dL (ref >39.00)
LDL Cholesterol: 137 mg/dL — ABNORMAL HIGH (ref 0–99)
NonHDL: 156.46
Total CHOL/HDL Ratio: 3
Triglycerides: 95 mg/dL (ref 0.0–149.0)
VLDL: 19 mg/dL (ref 0.0–40.0)

## 2022-11-20 LAB — COMPREHENSIVE METABOLIC PANEL
ALT: 14 U/L (ref 0–35)
AST: 15 U/L (ref 0–37)
Albumin: 3.9 g/dL (ref 3.5–5.2)
Alkaline Phosphatase: 53 U/L (ref 39–117)
BUN: 23 mg/dL (ref 6–23)
CO2: 25 mEq/L (ref 19–32)
Calcium: 9.1 mg/dL (ref 8.4–10.5)
Chloride: 111 mEq/L (ref 96–112)
Creatinine, Ser: 0.69 mg/dL (ref 0.40–1.20)
GFR: 89.18 mL/min (ref >60.00)
Glucose, Bld: 105 mg/dL — ABNORMAL HIGH (ref 70–99)
Potassium: 4.5 mEq/L (ref 3.5–5.1)
Sodium: 141 mEq/L (ref 135–145)
Total Bilirubin: 0.4 mg/dL (ref 0.2–1.2)
Total Protein: 6.1 g/dL (ref 6.0–8.3)

## 2022-11-20 LAB — TSH: TSH: 1.81 u[IU]/mL (ref 0.35–5.50)

## 2022-11-20 LAB — HEMOGLOBIN A1C: Hgb A1c MFr Bld: 5.3 % (ref 4.6–6.5)

## 2022-11-20 LAB — VITAMIN D 25 HYDROXY (VIT D DEFICIENCY, FRACTURES): VITD: 19.42 ng/mL — ABNORMAL LOW (ref 30.00–100.00)

## 2022-11-27 ENCOUNTER — Encounter: Payer: Self-pay | Admitting: Family Medicine

## 2022-11-27 ENCOUNTER — Other Ambulatory Visit: Payer: Self-pay

## 2022-11-27 ENCOUNTER — Ambulatory Visit (INDEPENDENT_AMBULATORY_CARE_PROVIDER_SITE_OTHER): Payer: Medicare PPO | Admitting: Family Medicine

## 2022-11-27 VITALS — BP 130/80 | HR 61 | Temp 97.8°F | Ht 63.75 in | Wt 224.4 lb

## 2022-11-27 DIAGNOSIS — G4733 Obstructive sleep apnea (adult) (pediatric): Secondary | ICD-10-CM

## 2022-11-27 DIAGNOSIS — E66812 Obesity, class 2: Secondary | ICD-10-CM

## 2022-11-27 DIAGNOSIS — R7309 Other abnormal glucose: Secondary | ICD-10-CM

## 2022-11-27 DIAGNOSIS — Z0000 Encounter for general adult medical examination without abnormal findings: Secondary | ICD-10-CM | POA: Diagnosis not present

## 2022-11-27 DIAGNOSIS — M810 Age-related osteoporosis without current pathological fracture: Secondary | ICD-10-CM

## 2022-11-27 DIAGNOSIS — E7800 Pure hypercholesterolemia, unspecified: Secondary | ICD-10-CM

## 2022-11-27 DIAGNOSIS — R0683 Snoring: Secondary | ICD-10-CM | POA: Diagnosis not present

## 2022-11-27 DIAGNOSIS — Z6838 Body mass index (BMI) 38.0-38.9, adult: Secondary | ICD-10-CM

## 2022-11-27 DIAGNOSIS — F330 Major depressive disorder, recurrent, mild: Secondary | ICD-10-CM | POA: Diagnosis not present

## 2022-11-27 MED ORDER — SERTRALINE HCL 100 MG PO TABS
100.0000 mg | ORAL_TABLET | Freq: Every day | ORAL | 3 refills | Status: DC
  Filled 2022-11-27 – 2023-01-05 (×2): qty 90, 90d supply, fill #0
  Filled 2023-04-09: qty 90, 90d supply, fill #1
  Filled 2023-07-23: qty 90, 90d supply, fill #2
  Filled 2023-10-25: qty 90, 90d supply, fill #3

## 2022-11-27 MED ORDER — ALENDRONATE SODIUM 70 MG PO TABS
70.0000 mg | ORAL_TABLET | ORAL | 3 refills | Status: DC
  Filled 2022-11-27: qty 12, fill #0
  Filled 2023-01-01: qty 12, 84d supply, fill #0
  Filled 2023-03-22: qty 12, 84d supply, fill #1
  Filled 2023-06-14: qty 12, 84d supply, fill #2
  Filled 2023-09-11: qty 12, 84d supply, fill #3

## 2022-11-27 MED ORDER — BUPROPION HCL ER (XL) 150 MG PO TB24
150.0000 mg | ORAL_TABLET | Freq: Every day | ORAL | 3 refills | Status: DC
  Filled 2022-11-27: qty 90, 90d supply, fill #0
  Filled 2023-02-22: qty 90, 90d supply, fill #1
  Filled 2023-06-14: qty 90, 90d supply, fill #2
  Filled 2023-09-11: qty 90, 90d supply, fill #3

## 2022-11-27 NOTE — Assessment & Plan Note (Signed)
Starting cpap Hope this will help energy level/mood and motivation

## 2022-11-27 NOTE — Assessment & Plan Note (Signed)
Reviewed health habits including diet and exercise and skin cancer prevention Reviewed appropriate screening tests for age  Also reviewed health mt list, fam hx and immunization status , as well as social and family history   See HPI Labs reviewed and ordered Mammogram scheduled this month  Colonosc utd 05/2018 Dexa utd 11/2022  no falls or fracture  PHQ 0 but lack of motivation-discussed adding wellburrin  On cpap now

## 2022-11-27 NOTE — Assessment & Plan Note (Signed)
Discussed how this problem influences overall health and the risks it imposes  Reviewed plan for weight loss with lower calorie diet (via better food choices (lower glycemic and portion control) along with exercise building up to or more than 30 minutes 5 days per week including some aerobic activity and strength training    Has recently lacked motivation  Some emotional eating Discussed trial of wellbutrin xl for this and depression

## 2022-11-27 NOTE — Assessment & Plan Note (Addendum)
Worse lately with anhedonia/lack of motivation and emotional eating as well as desire to isolate Continues sertraline Will add wellbutrin xl 500 mg daily  Follow up 2 mo  Declines counseling Encouraged self care   Discussed expectations of this medication including time to effectiveness and mechanism of action, also poss of side effects (early and late)- including mental fuzziness, weight or appetite change, nausea and poss of worse dep or anxiety (even suicidal thoughts)  Pt voiced understanding and will stop med and update if this occurs

## 2022-11-27 NOTE — Patient Instructions (Addendum)
Continue the cpap   If you are interested in the new shingles vaccine (Shingrix) - call your local pharmacy to check on coverage and availability  If affordable, get on a wait list at your pharmacy to get the vaccine.   Make sure you get at least 4000 international units of vitamin D daily (all together) Eat a balanced diet    Start wellbutrin xl 150 mg in the morning (for mood and appetite)  If any side effects or you eat worse let us know  Follow up here in 2 months    Once motivation improves Try to get most of your carbohydrates from produce (with the exception of white potatoes) and whole grains  Eat less bread/pasta/rice/snack foods/cereals/sweets and other items from the middle of the grocery store (processed carbs)  Add some strength training to your routine, this is important for bone and brain health and can reduce your risk of falls and help your body use insulin properly and regulate weight  Light weights, exercise bands , and internet videos are a good way to start  Yoga (chair or regular), machines , floor exercises or a gym with machines are also good options

## 2022-11-27 NOTE — Assessment & Plan Note (Signed)
Dexa 11/2022 Taking alendronate since 07/2019 -will plan 5 years No falls or fracture D level is low-discussed increase in this   Encouraged strength building exercise

## 2022-11-27 NOTE — Assessment & Plan Note (Signed)
Lab Results  Component Value Date   HGBA1C 5.3 11/20/2022   disc imp of low glycemic diet and wt loss to prevent DM2

## 2022-11-27 NOTE — Assessment & Plan Note (Signed)
Disc goals for lipids and reasons to control them Rev last labs with pt Rev low sat fat diet in detail HDL is down a it  Stable LDL

## 2022-11-27 NOTE — Assessment & Plan Note (Signed)
Dx with OSA Has cpap now

## 2022-11-27 NOTE — Progress Notes (Signed)
Subjective:    Patient ID: Emma Knight, female    DOB: 06/29/1955, 67 y.o.   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, forensic clothes fit   Eats the right thinks overall  Good choices  Eats too much      Immunization History  Administered Date(s) Administered   COVID-19, mRNA, vaccine(Comirnaty)12 years and older 04/11/2022   Fluad Quad(high Dose 65+) 02/26/2021, 02/02/2022   Influenza Inj Mdck Quad With Preservative 02/23/2018   Influenza Whole 01/25/2009, 02/26/2021   Influenza,inj,Quad PF,6+ Mos 03/06/2014, 02/25/2016   Influenza-Unspecified 01/21/2015, 02/04/2017, 01/21/2019   PFIZER(Purple Top)SARS-COV-2 Vaccination 05/20/2019, 06/10/2019, 02/27/2020   Pneumococcal Conjugate-13 07/23/2019   Pneumococcal Polysaccharide-23 09/21/2020   Td 05/09/2003   Tdap 03/18/2014   Shingrix: plans to get at the pharmacy   Health Maintenance Due  Topic Date Due   MAMMOGRAM  11/26/2022   Mammogram is scheduled for 7/24 Self breast exam: no lumps   Gyn -no problems    Colon cancer screening -colonoscopy 05/2018     Dexa 11/2022  Osteoporosis femoral neck-fairly stable with T score -2.7 Taking alendronate since 07/2019  Falls: none Fractures: none  Supplements -vitamin D  and mvi  Last vitamin D Lab Results  Component Value Date   VD25OH 19.42 (L) 11/20/2022    Exercise : none      Mood    09/06/2022   10:39 AM 10/31/2021    9:58 AM 09/15/2021   11:01 AM 09/14/2020   11:27 AM  07/23/2019    2:01 PM  Depression screen PHQ 2/9  Decreased Interest 0 0 0 0 0  Down, Depressed, Hopeless 0 0 0 0 0  PHQ - 2 Score 0 0 0 0 0  Altered sleeping    0   Tired, decreased energy    0   Change in appetite    0   Feeling bad or failure about yourself     0   Trouble concentrating    0   Moving slowly or fidgety/restless    0   Suicidal thoughts    0   PHQ-9 Score    0   Difficult doing work/chores    Not difficult at all    Takes zoloft  Not helping her motivation  Less joy  Isolating Does not want to be around people   Declines counseling    Was diagnosed with severe OSA  Has cpap a week ago  Is tolerating it now     Hyperlipidemia Lab Results  Component Value Date   CHOL 228 (H) 11/20/2022   CHOL 262 (H) 10/31/2021   CHOL 220 (H) 11/22/2020   Lab Results  Component Value Date   HDL 71.10 11/20/2022   HDL 107.90 10/31/2021   HDL 67.30 11/22/2020   Lab Results  Component Value Date   LDLCALC 137 (H)  11/20/2022   LDLCALC 134 (H) 10/31/2021   LDLCALC 134 (H) 11/22/2020   Lab Results  Component Value Date   TRIG 95.0 11/20/2022   TRIG 103.0 10/31/2021   TRIG 94.0 11/22/2020   Lab Results  Component Value Date   CHOLHDL 3 11/20/2022   CHOLHDL 2 10/31/2021   CHOLHDL 3 11/22/2020   Lab Results  Component Value Date   LDLDIRECT 130.7 08/05/2010   LDLDIRECT 157.7 06/14/2010   LDLDIRECT 153.9 03/08/2010  Improved with diet in past  Good HDL but down from last time/ less exercise  The 10-year ASCVD risk score (Arnett DK, et al., 2019) is: 7.7%   Values used to calculate the score:     Age: 58 years     Sex: Female     Is Non-Hispanic African American: No     Diabetic: No     Tobacco smoker: No     Systolic Blood Pressure: 130 mmHg     Is BP treated: No     HDL Cholesterol: 71.1 mg/dL     Total Cholesterol: 228 mg/dL   Glucose Lab Results  Component Value Date   HGBA1C 5.3 11/20/2022   This is stable   Lab Results  Component  Value Date   TSH 1.81 11/20/2022    Lab Results  Component Value Date   WBC 4.3 11/20/2022   HGB 12.4 11/20/2022   HCT 36.9 11/20/2022   MCV 94.4 11/20/2022   PLT 188.0 11/20/2022   Lab Results  Component Value Date   NA 141 11/20/2022   K 4.5 11/20/2022   CO2 25 11/20/2022   GLUCOSE 105 (H) 11/20/2022   BUN 23 11/20/2022   CREATININE 0.69 11/20/2022   CALCIUM 9.1 11/20/2022   GFR 89.18 11/20/2022   GFRNONAA 112.18 03/08/2010     Patient Active Problem List   Diagnosis Date Noted   Snoring 02/02/2022   Medicare annual wellness visit, subsequent 10/31/2021   Encounter for screening mammogram for breast cancer 10/31/2021   Acute systolic heart failure (HCC) 09/21/2020   Colon cancer screening 09/21/2020   Elevated glucose 09/21/2020   Welcome to Medicare preventive visit 07/23/2019   Hydradenitis 05/14/2017   Depression, major, recurrent (HCC) 02/25/2016   Family history of leukemia 03/18/2014   Encounter for routine gynecological examination 09/20/2012   Routine general medical examination at a health care facility 09/16/2012   OSA (obstructive sleep apnea) 02/16/2010   Class 2 obesity due to excess calories with body mass index (BMI) of 38.0 to 38.9 in adult 12/30/2009   Pure hypercholesterolemia 03/03/2009   Osteoporosis 05/20/2008   Disorder of bone and cartilage 05/20/2008   Past Medical History:  Diagnosis Date   Hyperlipidemia    Osteopenia    Sleep apnea    Past Surgical History:  Procedure Laterality Date   CESAREAN SECTION  10/28/1985   Social History   Tobacco Use   Smoking status: Never   Smokeless tobacco: Never  Vaping Use   Vaping status: Never Used  Substance Use Topics   Alcohol use: Yes    Comment: Occasionally   Drug use: No   Family History  Problem Relation Age of Onset   Bladder Cancer Brother    Cancer Mother    Cancer Father    Arthritis Brother    Cancer Brother    Hearing loss Brother    Breast cancer Neg Hx    No  Known Allergies Current Outpatient Medications on File Prior to Visit  Medication  Sig Dispense Refill   Biotin 1 MG CAPS Take by mouth.     cholecalciferol (VITAMIN D3) 25 MCG (1000 UNIT) tablet Take 2,000 Units by mouth daily.     Multiple Vitamins-Minerals (MULTIVITAMIN WITH MINERALS) tablet Take 1 tablet by mouth daily.     No current facility-administered medications on file prior to visit.    Review of Systems  Constitutional:  Positive for fatigue. Negative for activity change, appetite change, fever and unexpected weight change.  HENT:  Negative for congestion, ear pain, rhinorrhea, sinus pressure and sore throat.   Eyes:  Negative for pain, redness and visual disturbance.  Respiratory:  Negative for cough, shortness of breath and wheezing.   Cardiovascular:  Negative for chest pain and palpitations.  Gastrointestinal:  Negative for abdominal pain, blood in stool, constipation and diarrhea.  Endocrine: Positive for polyphagia. Negative for polydipsia and polyuria.  Genitourinary:  Negative for dysuria, frequency and urgency.  Musculoskeletal:  Positive for arthralgias. Negative for back pain and myalgias.  Skin:  Negative for pallor and rash.  Allergic/Immunologic: Negative for environmental allergies.  Neurological:  Negative for dizziness, syncope and headaches.  Hematological:  Negative for adenopathy. Does not bruise/bleed easily.  Psychiatric/Behavioral:  Positive for dysphoric mood and sleep disturbance. Negative for decreased concentration and suicidal ideas. The patient is not nervous/anxious.        Objective:   Physical Exam Constitutional:      General: She is not in acute distress.    Appearance: Normal appearance. She is well-developed. She is obese. She is not ill-appearing or diaphoretic.  HENT:     Head: Normocephalic and atraumatic.     Right Ear: Tympanic membrane, ear canal and external ear normal.     Left Ear: Tympanic membrane, ear canal and external  ear normal.     Nose: Nose normal. No congestion.     Mouth/Throat:     Mouth: Mucous membranes are moist.     Pharynx: Oropharynx is clear. No posterior oropharyngeal erythema.  Eyes:     General: No scleral icterus.    Extraocular Movements: Extraocular movements intact.     Conjunctiva/sclera: Conjunctivae normal.     Pupils: Pupils are equal, round, and reactive to light.  Neck:     Thyroid: No thyromegaly.     Vascular: No carotid bruit or JVD.  Cardiovascular:     Rate and Rhythm: Normal rate and regular rhythm.     Pulses: Normal pulses.     Heart sounds: Normal heart sounds.     No gallop.  Pulmonary:     Effort: Pulmonary effort is normal. No respiratory distress.     Breath sounds: Normal breath sounds. No wheezing.     Comments: Good air exch Chest:     Chest wall: No tenderness.  Abdominal:     General: Bowel sounds are normal. There is no distension or abdominal bruit.     Palpations: Abdomen is soft. There is no mass.     Tenderness: There is no abdominal tenderness.     Hernia: No hernia is present.  Genitourinary:    Comments: Breast exam: No mass, nodules, thickening, tenderness, bulging, retraction, inflamation, nipple discharge or skin changes noted.  No axillary or clavicular LA.     Musculoskeletal:        General: No tenderness. Normal range of motion.     Cervical back: Normal range of motion and neck supple. No rigidity. No muscular tenderness.     Right lower leg:  No edema.     Left lower leg: No edema.     Comments: No kyphosis   Lymphadenopathy:     Cervical: No cervical adenopathy.  Skin:    General: Skin is warm and dry.     Coloration: Skin is not pale.     Findings: No erythema or rash.     Comments: Solar lentigines diffusely   Neurological:     Mental Status: She is alert. Mental status is at baseline.     Cranial Nerves: No cranial nerve deficit.     Motor: No abnormal muscle tone.     Coordination: Coordination normal.     Gait:  Gait normal.     Deep Tendon Reflexes: Reflexes are normal and symmetric. Reflexes normal.  Psychiatric:        Attention and Perception: Attention normal.        Mood and Affect: Mood normal. Affect is blunt.        Cognition and Memory: Cognition and memory normal.     Comments: Candidly discusses symptoms and stressors             Assessment & Plan:   Problem List Items Addressed This Visit       Respiratory   OSA (obstructive sleep apnea)    Starting cpap Hope this will help energy level/mood and motivation         Musculoskeletal and Integument   Osteoporosis    Dexa 11/2022 Taking alendronate since 07/2019 -will plan 5 years No falls or fracture D level is low-discussed increase in this   Encouraged strength building exercise       Relevant Medications   cholecalciferol (VITAMIN D3) 25 MCG (1000 UNIT) tablet   alendronate (FOSAMAX) 70 MG tablet     Other   Snoring    Dx with OSA Has cpap now       Routine general medical examination at a health care facility - Primary    Reviewed health habits including diet and exercise and skin cancer prevention Reviewed appropriate screening tests for age  Also reviewed health mt list, fam hx and immunization status , as well as social and family history   See HPI Labs reviewed and ordered Mammogram scheduled this month  Colonosc utd 05/2018 Dexa utd 11/2022  no falls or fracture  PHQ 0 but lack of motivation-discussed adding wellburrin  On cpap now       Pure hypercholesterolemia    Disc goals for lipids and reasons to control them Rev last labs with pt Rev low sat fat diet in detail HDL is down a it  Stable LDL         Elevated glucose    Lab Results  Component Value Date   HGBA1C 5.3 11/20/2022   disc imp of low glycemic diet and wt loss to prevent DM2       Depression, major, recurrent (HCC)    Worse lately with anhedonia/lack of motivation and emotional eating as well as desire to  isolate Continues sertraline Will add wellbutrin xl 500 mg daily  Follow up 2 mo  Declines counseling Encouraged self care   Discussed expectations of this medication including time to effectiveness and mechanism of action, also poss of side effects (early and late)- including mental fuzziness, weight or appetite change, nausea and poss of worse dep or anxiety (even suicidal thoughts)  Pt voiced understanding and will stop med and update if this occurs  Relevant Medications   buPROPion (WELLBUTRIN XL) 150 MG 24 hr tablet   sertraline (ZOLOFT) 100 MG tablet   Class 2 obesity due to excess calories with body mass index (BMI) of 38.0 to 38.9 in adult    Discussed how this problem influences overall health and the risks it imposes  Reviewed plan for weight loss with lower calorie diet (via better food choices (lower glycemic and portion control) along with exercise building up to or more than 30 minutes 5 days per week including some aerobic activity and strength training    Has recently lacked motivation  Some emotional eating Discussed trial of wellbutrin xl for this and depression

## 2022-11-29 ENCOUNTER — Ambulatory Visit
Admission: RE | Admit: 2022-11-29 | Discharge: 2022-11-29 | Disposition: A | Discharge status: Home / Self Care | Payer: Medicare PPO | Source: Ambulatory Visit | Attending: Family Medicine | Admitting: Family Medicine

## 2022-11-29 ENCOUNTER — Other Ambulatory Visit: Payer: Self-pay

## 2022-11-29 DIAGNOSIS — Z1231 Encounter for screening mammogram for malignant neoplasm of breast: Secondary | ICD-10-CM | POA: Diagnosis not present

## 2022-12-17 DIAGNOSIS — G4733 Obstructive sleep apnea (adult) (pediatric): Secondary | ICD-10-CM | POA: Diagnosis not present

## 2022-12-27 DIAGNOSIS — S42252A Displaced fracture of greater tuberosity of left humerus, initial encounter for closed fracture: Secondary | ICD-10-CM | POA: Diagnosis not present

## 2022-12-28 ENCOUNTER — Telehealth: Payer: Self-pay | Admitting: Family Medicine

## 2022-12-28 ENCOUNTER — Ambulatory Visit: Payer: Medicare PPO | Admitting: Family Medicine

## 2022-12-28 ENCOUNTER — Other Ambulatory Visit: Payer: Self-pay

## 2022-12-28 ENCOUNTER — Encounter: Payer: Self-pay | Admitting: Family Medicine

## 2022-12-28 VITALS — BP 164/94 | HR 76 | Temp 98.2°F | Ht 63.75 in | Wt 225.0 lb

## 2022-12-28 DIAGNOSIS — I10 Essential (primary) hypertension: Secondary | ICD-10-CM

## 2022-12-28 HISTORY — DX: Essential (primary) hypertension: I10

## 2022-12-28 MED ORDER — HYDROCHLOROTHIAZIDE 25 MG PO TABS
25.0000 mg | ORAL_TABLET | Freq: Every day | ORAL | 3 refills | Status: DC
  Filled 2022-12-28: qty 30, 30d supply, fill #0
  Filled 2023-01-17 – 2023-02-05 (×2): qty 30, 30d supply, fill #1
  Filled 2023-03-05: qty 30, 30d supply, fill #2

## 2022-12-28 NOTE — Assessment & Plan Note (Signed)
New diagnosis, she has had 3 measurements of elevated blood pressure in different settings.  Of note this has been noted since she had surgery within the last week.  Elevated blood pressure may be related to pain as well as possibly secondary to anti-inflammatory/tramadol.  She has had normal EKG and lab evaluation during her hospitalization in [place]. I will start her on hydrochlorothiazide 25 mg daily for blood pressure control and to help with peripheral swelling.  She will follow her blood pressure at home and follow-up in 2 weeks.  Of note as her pain improves and as she is able to wean off pain medication she may note blood pressure returned to normal range.

## 2022-12-28 NOTE — Progress Notes (Signed)
Patient ID: Emma Knight, female    DOB: 1956/03/30, 67 y.o.   MRN: [id number]  This visit was conducted in person.  BP (!) 164/94   Pulse 76   Temp 98.2 F (36.8 C)   Ht 5' 3.75" (1.619 m)   Wt 225 lb (102.1 kg)   SpO2 97%   BMI 38.92 kg/m    CC:  Chief Complaint  Patient presents with   Hypertension    Subjective:   HPI: Emma Knight is a 67 y.o. female patient of Dr. Lucretia Knight with history of hypertension, BMI 38 presenting on 12/28/2022 for Hypertension   She fell last week in [place]. Lost balance and fell.  Broke humeral head... had surgery 7 pins.  Noted BP was elevated at [place] hospital.. 199/90  Minimal pain now.   Using tramadol and  liquid anti-inflammatory for pain.   Followed up with ORTHO Dr. Franco Knight  She reports recent blood pressure elevations, she is not on any medication. She has OSA.Marland Kitchen was starting CPAP per last PCP note 7.2024 BP Readings from Last 3 Encounters:  12/28/22 (!) 164/94  11/27/22 130/80  02/02/22 (!) 142/78  Using medication without problems or lightheadedness:  none Chest pain with exertion: none.. has some bruising from fall. Edema: yes Short of breath: none Average home BPs: 172/83, HR 62,74 Other issues:     No family history of HTN.   EKG nml in hospital.    Relevant past medical, surgical, family and social history reviewed and updated as indicated. Interim medical history since our last visit reviewed. Allergies and medications reviewed and updated. Outpatient Medications Prior to Visit  Medication Sig Dispense Refill   alendronate (FOSAMAX) 70 MG tablet TAKE 1 TABLET BY MOUTH EVERY 7 DAYS. TAKE WITH A FULL GLASS OF WATER ON AN EMPTY STOMACH. 12 tablet 3   buPROPion (WELLBUTRIN XL) 150 MG 24 hr tablet Take 1 tablet (150 mg total) by mouth daily. In am 90 tablet 3   cholecalciferol (VITAMIN D3) 25 MCG (1000 UNIT) tablet Take 2,000 Units by mouth daily.     Multiple Vitamins-Minerals (MULTIVITAMIN WITH  MINERALS) tablet Take 1 tablet by mouth daily.     sertraline (ZOLOFT) 100 MG tablet Take 1 tablet (100 mg total) by mouth at bedtime. 90 tablet 3   Biotin 1 MG CAPS Take by mouth.     No facility-administered medications prior to visit.     Per HPI unless specifically indicated in ROS section below Review of Systems  Constitutional:  Negative for fatigue and fever.  HENT:  Negative for congestion.   Eyes:  Negative for pain.  Respiratory:  Negative for cough and shortness of breath.   Cardiovascular:  Negative for chest pain, palpitations and leg swelling.  Gastrointestinal:  Negative for abdominal pain.  Genitourinary:  Negative for dysuria and vaginal bleeding.  Musculoskeletal:  Negative for back pain.  Neurological:  Negative for syncope, light-headedness and headaches.  Psychiatric/Behavioral:  Negative for dysphoric mood.    Objective:  BP (!) 164/94   Pulse 76   Temp 98.2 F (36.8 C)   Ht 5' 3.75" (1.619 m)   Wt 225 lb (102.1 kg)   SpO2 97%   BMI 38.92 kg/m   Wt Readings from Last 3 Encounters:  12/28/22 225 lb (102.1 kg)  11/27/22 224 lb 6 oz (101.8 kg)  09/06/22 199 lb (90.3 kg)      Physical Exam Constitutional:      General: She  is not in acute distress.    Appearance: Normal appearance. She is well-developed. She is not ill-appearing or toxic-appearing.  HENT:     Head: Normocephalic.     Right Ear: Hearing, tympanic membrane, ear canal and external ear normal. Tympanic membrane is not erythematous, retracted or bulging.     Left Ear: Hearing, tympanic membrane, ear canal and external ear normal. Tympanic membrane is not erythematous, retracted or bulging.     Nose: No mucosal edema or rhinorrhea.     Right Sinus: No maxillary sinus tenderness or frontal sinus tenderness.     Left Sinus: No maxillary sinus tenderness or frontal sinus tenderness.     Mouth/Throat:     Mouth: Oropharynx is clear and moist and mucous membranes are normal.     Pharynx: Uvula  midline.  Eyes:     General: Lids are normal. Lids are everted, no foreign bodies appreciated.     Extraocular Movements: EOM normal.     Conjunctiva/sclera: Conjunctivae normal.     Pupils: Pupils are equal, round, and reactive to light.  Neck:     Thyroid: No thyroid mass or thyromegaly.     Vascular: No carotid bruit.     Trachea: Trachea normal.  Cardiovascular:     Rate and Rhythm: Normal rate and regular rhythm.     Pulses: Normal pulses.     Heart sounds: Normal heart sounds, S1 normal and S2 normal. No murmur heard.    No friction rub. No gallop.  Pulmonary:     Effort: Pulmonary effort is normal. No tachypnea or respiratory distress.     Breath sounds: Normal breath sounds. No decreased breath sounds, wheezing, rhonchi or rales.  Abdominal:     General: Bowel sounds are normal.     Palpations: Abdomen is soft.     Tenderness: There is no abdominal tenderness.  Musculoskeletal:     Cervical back: Normal range of motion and neck supple.  Skin:    General: Skin is warm, dry and intact.     Findings: No rash.  Neurological:     Mental Status: She is alert.  Psychiatric:        Mood and Affect: Mood is not anxious or depressed.        Speech: Speech normal.        Behavior: Behavior normal. Behavior is cooperative.        Thought Content: Thought content normal.        Cognition and Memory: Cognition and memory normal.        Judgment: Judgment normal.       Results for orders placed or performed in visit on 11/20/22  VITAMIN D 25 Hydroxy (Vit-D Deficiency, Fractures)  Result Value Ref Range   VITD 19.42 (L) 30.00 - 100.00 ng/mL  Hemoglobin A1c  Result Value Ref Range   Hgb A1c MFr Bld 5.3 4.6 - 6.5 %  CBC with Differential/Platelet  Result Value Ref Range   WBC 4.3 4.0 - 10.5 K/uL   RBC 3.91 3.87 - 5.11 Mil/uL   Hemoglobin 12.4 12.0 - 15.0 g/dL   HCT 16.1 09.6 - 04.5 %   MCV 94.4 78.0 - 100.0 fl   MCHC 33.6 30.0 - 36.0 g/dL   RDW 40.9 81.1 - 91.4 %    Platelets 188.0 150.0 - 400.0 K/uL   Neutrophils Relative % 54.0 43.0 - 77.0 %   Lymphocytes Relative 36.5 12.0 - 46.0 %   Monocytes Relative 7.9 3.0 - 12.0 %  Eosinophils Relative 1.0 0.0 - 5.0 %   Basophils Relative 0.6 0.0 - 3.0 %   Neutro Abs 2.3 1.4 - 7.7 K/uL   Lymphs Abs 1.6 0.7 - 4.0 K/uL   Monocytes Absolute 0.3 0.1 - 1.0 K/uL   Eosinophils Absolute 0.0 0.0 - 0.7 K/uL   Basophils Absolute 0.0 0.0 - 0.1 K/uL  Comprehensive metabolic panel  Result Value Ref Range   Sodium 141 135 - 145 mEq/L   Potassium 4.5 3.5 - 5.1 mEq/L   Chloride 111 96 - 112 mEq/L   CO2 25 19 - 32 mEq/L   Glucose, Bld 105 (H) 70 - 99 mg/dL   BUN 23 6 - 23 mg/dL   Creatinine, Ser 1.61 0.40 - 1.20 mg/dL   Total Bilirubin 0.4 0.2 - 1.2 mg/dL   Alkaline Phosphatase 53 39 - 117 U/L   AST 15 0 - 37 U/L   ALT 14 0 - 35 U/L   Total Protein 6.1 6.0 - 8.3 g/dL   Albumin 3.9 3.5 - 5.2 g/dL   GFR 09.60 >45.40 mL/min   Calcium 9.1 8.4 - 10.5 mg/dL  Lipid panel  Result Value Ref Range   Cholesterol 228 (H) 0 - 200 mg/dL   Triglycerides 98.1 0.0 - 149.0 mg/dL   HDL 19.14 >78.29 mg/dL   VLDL 56.2 0.0 - 13.0 mg/dL   LDL Cholesterol 865 (H) 0 - 99 mg/dL   Total CHOL/HDL Ratio 3    NonHDL 156.46   TSH  Result Value Ref Range   TSH 1.81 0.35 - 5.50 uIU/mL    Assessment and Plan  Primary hypertension Assessment & Plan: New diagnosis, she has had 3 measurements of elevated blood pressure in different settings.  Of note this has been noted since she had surgery within the last week.  Elevated blood pressure may be related to pain as well as possibly secondary to anti-inflammatory/tramadol.  She has had normal EKG and lab evaluation during her hospitalization in [place]. I will start her on hydrochlorothiazide 25 mg daily for blood pressure control and to help with peripheral swelling.  She will follow her blood pressure at home and follow-up in 2 weeks.  Of note as her pain improves and as she is able to  wean off pain medication she may note blood pressure returned to normal range.   Other orders -     hydroCHLOROthiazide; Take 1 tablet (25 mg total) by mouth daily.  Dispense: 30 tablet; Refill: 3    Return in about 2 weeks (around 01/11/2023) for  follow up hypertension.   Kerby Nora, MD

## 2022-12-28 NOTE — Telephone Encounter (Signed)
FYI: This call has been transferred to Access Nurse. Once the result note has been entered staff can address the message at that time.  Patient called in with the following symptoms:  Red Word:elevated blood pressure have been running in the  191/101 since she had surgery on her humerus on Thursday   Please advise at Mobile [phone] (mobile)  Message is routed to Provider Pool and [facility] Triage

## 2022-12-28 NOTE — Telephone Encounter (Signed)
Per appt notes pt already has appt with Dr Ermalene Searing 12/28/22 at 12 noon. Sending note to Dr Ermalene Searing and [place] pool.

## 2022-12-28 NOTE — Telephone Encounter (Signed)
Noted  

## 2023-01-01 ENCOUNTER — Other Ambulatory Visit: Payer: Self-pay

## 2023-01-01 MED ORDER — OXYCODONE HCL 5 MG PO TABS
5.0000 mg | ORAL_TABLET | Freq: Four times a day (QID) | ORAL | 0 refills | Status: DC | PRN
  Filled 2023-01-01: qty 3, 1d supply, fill #0
  Filled 2023-01-01: qty 17, 2d supply, fill #0

## 2023-01-02 DIAGNOSIS — S42252D Displaced fracture of greater tuberosity of left humerus, subsequent encounter for fracture with routine healing: Secondary | ICD-10-CM | POA: Diagnosis not present

## 2023-01-03 DIAGNOSIS — S42232A 3-part fracture of surgical neck of left humerus, initial encounter for closed fracture: Secondary | ICD-10-CM | POA: Diagnosis not present

## 2023-01-05 ENCOUNTER — Other Ambulatory Visit: Payer: Self-pay

## 2023-01-09 ENCOUNTER — Other Ambulatory Visit: Payer: Self-pay

## 2023-01-09 DIAGNOSIS — S42252D Displaced fracture of greater tuberosity of left humerus, subsequent encounter for fracture with routine healing: Secondary | ICD-10-CM | POA: Diagnosis not present

## 2023-01-09 MED ORDER — CEPHALEXIN 500 MG PO CAPS
500.0000 mg | ORAL_CAPSULE | Freq: Two times a day (BID) | ORAL | 0 refills | Status: DC
  Filled 2023-01-09: qty 20, 10d supply, fill #0

## 2023-01-09 MED ORDER — OXYCODONE HCL 5 MG PO TABS
5.0000 mg | ORAL_TABLET | Freq: Four times a day (QID) | ORAL | 0 refills | Status: DC | PRN
  Filled 2023-01-09: qty 10, 2d supply, fill #0

## 2023-01-12 ENCOUNTER — Encounter: Payer: Self-pay | Admitting: Family Medicine

## 2023-01-12 ENCOUNTER — Ambulatory Visit: Payer: Medicare PPO | Admitting: Family Medicine

## 2023-01-12 VITALS — BP 131/75 | HR 76 | Temp 97.6°F | Ht 63.75 in | Wt 219.5 lb

## 2023-01-12 DIAGNOSIS — I10 Essential (primary) hypertension: Secondary | ICD-10-CM

## 2023-01-12 DIAGNOSIS — Z8781 Personal history of (healed) traumatic fracture: Secondary | ICD-10-CM | POA: Insufficient documentation

## 2023-01-12 DIAGNOSIS — Z23 Encounter for immunization: Secondary | ICD-10-CM

## 2023-01-12 LAB — BASIC METABOLIC PANEL
BUN: 23 mg/dL (ref 6–23)
CO2: 30 meq/L (ref 19–32)
Calcium: 9.2 mg/dL (ref 8.4–10.5)
Chloride: 103 meq/L (ref 96–112)
Creatinine, Ser: 0.54 mg/dL (ref 0.40–1.20)
GFR: 94.51 mL/min (ref >60.00)
Glucose, Bld: 90 mg/dL (ref 70–99)
Potassium: 4 meq/L (ref 3.5–5.1)
Sodium: 140 meq/L (ref 135–145)

## 2023-01-12 NOTE — Assessment & Plan Note (Addendum)
Recent proximal humerus fracture in [place] - was operated on (tripped going up some stairs with arms full)  In sling  Making improvement Followed by ortho  Taking oxycodone at night  Some ibuprofen (watching blood pressure)  Reviewed last ortho note and plan today  Unsure if she will need PT after   Is on alendronate for osteoporosis

## 2023-01-12 NOTE — Assessment & Plan Note (Addendum)
Improved with hydrochlorothiazide 25 mg daily from Dr Ermalene Searing Reviewed last note/plan from her today No doubt pain from recent humerus fracture added to the blood pressure  BP: 131/75   Tolerates well and ankle edema is improved  Bmet ordered   Discussed lifestyle change/ DASH eating and exercise when released to do so   Plan follow up in 2 months

## 2023-01-12 NOTE — Progress Notes (Signed)
Subjective:    Patient ID: Emma Knight, female    DOB: [birth year], 67 y.o.   MRN: [id number]  HPI  Wt Readings from Last 3 Encounters:  01/12/23 219 lb 8 oz (99.6 kg)  12/28/22 225 lb (102.1 kg)  11/27/22 224 lb 6 oz (101.8 kg)   37.97 kg/m  Vitals:   01/12/23 1152 01/12/23 1220  BP: (!) 142/90 131/75  Pulse: 76   Temp: 97.6 F (36.4 C)   SpO2: 95%    Pt saw Dr Ermalene Searing since last visit with me  Had 3 high blood pressure measurements in different settings  Also had surgery and hosp in [place] - blamed some elevation on pain  (proximal humerus fracture)   In a sling Is healing    She started hydrochlorothiazide 25 mg daily for blood pressure and also to help swelling    BP Readings from Last 3 Encounters:  01/12/23 131/75  12/28/22 (!) 164/94  11/27/22 130/80   Pulse Readings from Last 3 Encounters:  01/12/23 76  12/28/22 76  11/27/22 61   No side effects  Tolerates well  Swelling did improve    On wellbutrin for mood and appetite   Lab Results  Component Value Date   NA 141 11/20/2022   K 4.5 11/20/2022   CO2 25 11/20/2022   GLUCOSE 105 (H) 11/20/2022   BUN 23 11/20/2022   CREATININE 0.69 11/20/2022   CALCIUM 9.1 11/20/2022   GFR 89.18 11/20/2022   GFRNONAA 112.18 03/08/2010   Lab Results  Component Value Date   CHOL 228 (H) 11/20/2022   HDL 71.10 11/20/2022   LDLCALC 137 (H) 11/20/2022   LDLDIRECT 130.7 08/05/2010   TRIG 95.0 11/20/2022   CHOLHDL 3 11/20/2022      Patient Active Problem List   Diagnosis Date Noted   History of humerus fracture 01/12/2023   Primary hypertension 12/28/2022   Snoring 02/02/2022   Medicare annual wellness visit, subsequent 10/31/2021   Encounter for screening mammogram for breast cancer 10/31/2021   Colon cancer screening 09/21/2020   Elevated glucose 09/21/2020   Welcome to Medicare preventive visit 07/23/2019   Hydradenitis 05/14/2017   Depression, major, recurrent (HCC) 02/25/2016   Family  history of leukemia 03/18/2014   Encounter for routine gynecological examination 09/20/2012   Routine general medical examination at a health care facility 09/16/2012   OSA (obstructive sleep apnea) 02/16/2010   Class 2 obesity due to excess calories with body mass index (BMI) of 38.0 to 38.9 in adult 12/30/2009   Pure hypercholesterolemia 03/03/2009   Osteoporosis 05/20/2008   Disorder of bone and cartilage 05/20/2008   Past Medical History:  Diagnosis Date   Hyperlipidemia    Osteopenia    Primary hypertension 12/28/2022   Sleep apnea    Past Surgical History:  Procedure Laterality Date   CESAREAN SECTION  10/28/1985   Social History   Tobacco Use   Smoking status: Never   Smokeless tobacco: Never  Vaping Use   Vaping status: Never Used  Substance Use Topics   Alcohol use: Yes    Comment: Occasionally   Drug use: No   Family History  Problem Relation Age of Onset   Bladder Cancer Brother    Cancer Mother    Cancer Father    Arthritis Brother    Cancer Brother    Hearing loss Brother    Breast cancer Neg Hx    No Known Allergies Current Outpatient Medications on File Prior to Visit  Medication Sig Dispense Refill   alendronate (FOSAMAX) 70 MG tablet Take 1 tablet (70 mg total) by mouth every 7 (seven) days. TAKE WITH A FULL GLASS OF WATER ON AN EMPTY STOMACH. 12 tablet 3   buPROPion (WELLBUTRIN XL) 150 MG 24 hr tablet Take 1 tablet (150 mg total) by mouth daily. In am 90 tablet 3   cephALEXin (KEFLEX) 500 MG capsule Take 1 capsule (500 mg total) by mouth 2 (two) times daily for 10 days. 20 capsule 0   cholecalciferol (VITAMIN D3) 25 MCG (1000 UNIT) tablet Take 2,000 Units by mouth daily.     hydrochlorothiazide (HYDRODIURIL) 25 MG tablet Take 1 tablet (25 mg total) by mouth daily. 30 tablet 3   Multiple Vitamins-Minerals (MULTIVITAMIN WITH MINERALS) tablet Take 1 tablet by mouth daily.     oxyCODONE (OXY IR/ROXICODONE) 5 MG immediate release tablet Take 1-2 tablets  (5-10 mg total) by mouth every 6 (six) hours as needed. 10 tablet 0   sertraline (ZOLOFT) 100 MG tablet Take 1 tablet (100 mg total) by mouth at bedtime. 90 tablet 3   No current facility-administered medications on file prior to visit.    Review of Systems  Constitutional:  Negative for activity change, appetite change, fatigue, fever and unexpected weight change.  HENT:  Negative for congestion, ear pain, rhinorrhea, sinus pressure and sore throat.   Eyes:  Negative for pain, redness and visual disturbance.  Respiratory:  Negative for cough, shortness of breath and wheezing.   Cardiovascular:  Negative for chest pain and palpitations.  Gastrointestinal:  Negative for abdominal pain, blood in stool, constipation and diarrhea.  Endocrine: Negative for polydipsia and polyuria.  Genitourinary:  Negative for dysuria, frequency and urgency.  Musculoskeletal:  Negative for arthralgias, back pain and myalgias.       Recent left humerus fracture  Some pain   Skin:  Negative for pallor and rash.  Allergic/Immunologic: Negative for environmental allergies.  Neurological:  Negative for dizziness, syncope and headaches.  Hematological:  Negative for adenopathy. Does not bruise/bleed easily.  Psychiatric/Behavioral:  Negative for decreased concentration and dysphoric mood. The patient is not nervous/anxious.        Objective:   Physical Exam Constitutional:      General: She is not in acute distress.    Appearance: Normal appearance. She is well-developed. She is obese. She is not ill-appearing or diaphoretic.  HENT:     Head: Normocephalic and atraumatic.  Eyes:     Conjunctiva/sclera: Conjunctivae normal.     Pupils: Pupils are equal, round, and reactive to light.  Neck:     Thyroid: No thyromegaly.     Vascular: No carotid bruit or JVD.  Cardiovascular:     Rate and Rhythm: Normal rate and regular rhythm.     Heart sounds: Normal heart sounds.     No gallop.  Pulmonary:     Effort:  Pulmonary effort is normal. No respiratory distress.     Breath sounds: Normal breath sounds. No wheezing or rales.  Abdominal:     General: There is no distension or abdominal bruit.     Palpations: Abdomen is soft.  Musculoskeletal:     Cervical back: Normal range of motion and neck supple.     Right lower leg: No edema.     Left lower leg: No edema.  Lymphadenopathy:     Cervical: No cervical adenopathy.  Skin:    General: Skin is warm and dry.     Coloration: Skin is  not pale.     Findings: No rash.  Neurological:     Mental Status: She is alert.     Cranial Nerves: No cranial nerve deficit.     Coordination: Coordination normal.     Deep Tendon Reflexes: Reflexes are normal and symmetric. Reflexes normal.  Psychiatric:        Mood and Affect: Mood normal.           Assessment & Plan:   Problem List Items Addressed This Visit       Cardiovascular and Mediastinum   Primary hypertension - Primary    Improved with hydrochlorothiazide 25 mg daily from Dr Ermalene Searing Reviewed last note/plan from her today No doubt pain from recent humerus fracture added to the blood pressure  BP: 131/75   Tolerates well and ankle edema is improved  Bmet ordered   Discussed lifestyle change/ DASH eating and exercise when released to do so   Plan follow up in 2 months       Relevant Orders   Basic metabolic panel     Other   History of humerus fracture    Recent proximal humerus fracture in [place] - was operated on (tripped going up some stairs with arms full)  In sling  Making improvement Followed by ortho  Taking oxycodone at night  Some ibuprofen (watching blood pressure)  Reviewed last ortho note and plan today  Unsure if she will need PT after   Is on alendronate for osteoporosis       Other Visit Diagnoses     Need for influenza vaccination       Relevant Orders   Flu Vaccine Trivalent High Dose (Fluad) (Completed)

## 2023-01-12 NOTE — Patient Instructions (Addendum)
Take a look at the [facility] eating plan  Eat as well as you can   Start exercising when you are able and safe to do so    Continue the hydrochlorothiazide  Blood pressure is improved   Lab today   Follow up in 2 months

## 2023-01-17 ENCOUNTER — Other Ambulatory Visit: Payer: Self-pay

## 2023-01-17 DIAGNOSIS — G4733 Obstructive sleep apnea (adult) (pediatric): Secondary | ICD-10-CM | POA: Diagnosis not present

## 2023-01-18 DIAGNOSIS — S42225D 2-part nondisplaced fracture of surgical neck of left humerus, subsequent encounter for fracture with routine healing: Secondary | ICD-10-CM | POA: Diagnosis not present

## 2023-01-31 ENCOUNTER — Other Ambulatory Visit: Payer: Self-pay

## 2023-02-05 ENCOUNTER — Other Ambulatory Visit: Payer: Self-pay

## 2023-02-05 DIAGNOSIS — S42252D Displaced fracture of greater tuberosity of left humerus, subsequent encounter for fracture with routine healing: Secondary | ICD-10-CM | POA: Diagnosis not present

## 2023-02-07 DIAGNOSIS — G4733 Obstructive sleep apnea (adult) (pediatric): Secondary | ICD-10-CM | POA: Diagnosis not present

## 2023-02-08 ENCOUNTER — Other Ambulatory Visit: Payer: Self-pay

## 2023-02-08 DIAGNOSIS — S42252D Displaced fracture of greater tuberosity of left humerus, subsequent encounter for fracture with routine healing: Secondary | ICD-10-CM | POA: Diagnosis not present

## 2023-02-08 MED ORDER — SHINGRIX 50 MCG/0.5ML IM SUSR
INTRAMUSCULAR | 0 refills | Status: DC
  Filled 2023-02-08: qty 0.5, 1d supply, fill #0

## 2023-02-08 MED ORDER — METHYLPREDNISOLONE 4 MG PO TBPK
ORAL_TABLET | ORAL | 0 refills | Status: DC
  Filled 2023-02-08: qty 21, 6d supply, fill #0

## 2023-02-08 MED ORDER — MELOXICAM 15 MG PO TABS
15.0000 mg | ORAL_TABLET | Freq: Every day | ORAL | 0 refills | Status: DC
  Filled 2023-02-08: qty 90, 90d supply, fill #0

## 2023-02-08 MED ORDER — METHOCARBAMOL 750 MG PO TABS
780.0000 mg | ORAL_TABLET | Freq: Three times a day (TID) | ORAL | 0 refills | Status: DC
  Filled 2023-02-08: qty 15, 5d supply, fill #0

## 2023-02-12 ENCOUNTER — Other Ambulatory Visit: Payer: Self-pay

## 2023-02-12 DIAGNOSIS — S42252D Displaced fracture of greater tuberosity of left humerus, subsequent encounter for fracture with routine healing: Secondary | ICD-10-CM | POA: Diagnosis not present

## 2023-02-12 MED ORDER — METHYLPREDNISOLONE 4 MG PO TBPK
ORAL_TABLET | ORAL | 0 refills | Status: DC
  Filled 2023-02-12 – 2023-02-22 (×2): qty 21, 6d supply, fill #0

## 2023-02-12 MED ORDER — OXYCODONE HCL 5 MG PO TABS
ORAL_TABLET | ORAL | 0 refills | Status: DC
  Filled 2023-02-12: qty 10, 2d supply, fill #0

## 2023-02-14 DIAGNOSIS — S42252D Displaced fracture of greater tuberosity of left humerus, subsequent encounter for fracture with routine healing: Secondary | ICD-10-CM | POA: Diagnosis not present

## 2023-02-16 DIAGNOSIS — G4733 Obstructive sleep apnea (adult) (pediatric): Secondary | ICD-10-CM | POA: Diagnosis not present

## 2023-02-21 DIAGNOSIS — S42252D Displaced fracture of greater tuberosity of left humerus, subsequent encounter for fracture with routine healing: Secondary | ICD-10-CM | POA: Diagnosis not present

## 2023-02-22 ENCOUNTER — Other Ambulatory Visit: Payer: Self-pay

## 2023-02-22 DIAGNOSIS — G4733 Obstructive sleep apnea (adult) (pediatric): Secondary | ICD-10-CM | POA: Diagnosis not present

## 2023-02-23 DIAGNOSIS — S42252D Displaced fracture of greater tuberosity of left humerus, subsequent encounter for fracture with routine healing: Secondary | ICD-10-CM | POA: Diagnosis not present

## 2023-02-26 DIAGNOSIS — H2513 Age-related nuclear cataract, bilateral: Secondary | ICD-10-CM | POA: Diagnosis not present

## 2023-03-02 DIAGNOSIS — S42252D Displaced fracture of greater tuberosity of left humerus, subsequent encounter for fracture with routine healing: Secondary | ICD-10-CM | POA: Diagnosis not present

## 2023-03-05 ENCOUNTER — Other Ambulatory Visit: Payer: Self-pay

## 2023-03-05 DIAGNOSIS — S42252D Displaced fracture of greater tuberosity of left humerus, subsequent encounter for fracture with routine healing: Secondary | ICD-10-CM | POA: Diagnosis not present

## 2023-03-07 DIAGNOSIS — S42252D Displaced fracture of greater tuberosity of left humerus, subsequent encounter for fracture with routine healing: Secondary | ICD-10-CM | POA: Diagnosis not present

## 2023-03-14 ENCOUNTER — Other Ambulatory Visit: Payer: Self-pay

## 2023-03-14 ENCOUNTER — Ambulatory Visit: Payer: Medicare PPO | Admitting: Family Medicine

## 2023-03-14 ENCOUNTER — Encounter: Payer: Self-pay | Admitting: Family Medicine

## 2023-03-14 VITALS — BP 132/80 | HR 62 | Temp 98.5°F | Ht 63.75 in | Wt 215.4 lb

## 2023-03-14 DIAGNOSIS — I10 Essential (primary) hypertension: Secondary | ICD-10-CM | POA: Diagnosis not present

## 2023-03-14 DIAGNOSIS — E7800 Pure hypercholesterolemia, unspecified: Secondary | ICD-10-CM

## 2023-03-14 DIAGNOSIS — Z8781 Personal history of (healed) traumatic fracture: Secondary | ICD-10-CM | POA: Diagnosis not present

## 2023-03-14 MED ORDER — HYDROCHLOROTHIAZIDE 25 MG PO TABS
25.0000 mg | ORAL_TABLET | Freq: Every day | ORAL | 3 refills | Status: DC
  Filled 2023-03-14 – 2023-04-09 (×3): qty 90, 90d supply, fill #0
  Filled 2023-07-28: qty 90, 90d supply, fill #1
  Filled 2023-10-25: qty 90, 90d supply, fill #2
  Filled 2024-01-29: qty 90, 90d supply, fill #3

## 2023-03-14 NOTE — Assessment & Plan Note (Signed)
bp in fair control at this time  BP Readings from Last 1 Encounters:  03/14/23 132/80   No changes needed Plan to continue hydrochlorothiazide 25 mg daily  Most recent labs reviewed  Disc lifstyle change with low sodium diet and exercise -doing well with this

## 2023-03-14 NOTE — Assessment & Plan Note (Signed)
Continues PT  Gradually improving  Discussed plan for exercise when released

## 2023-03-14 NOTE — Progress Notes (Signed)
Subjective:    Patient ID: Emma Knight, female    DOB: 28-Sep-1955, 67 y.o.   MRN: [id number]  HPI  Wt Readings from Last 3 Encounters:  03/14/23 215 lb 6 oz (97.7 kg)  01/12/23 219 lb 8 oz (99.6 kg)  12/28/22 225 lb (102.1 kg)   37.26 kg/m  Vitals:   03/14/23 0954 03/14/23 1010  BP: 132/84 132/80  Pulse: 62   Temp: 98.5 F (36.9 C)   SpO2: 97%     Pt presents for follow up of HTN   bp is stable today  No cp or palpitations or headaches or edema  No side effects to medicines  BP Readings from Last 3 Encounters:  03/14/23 132/80  01/12/23 131/75  12/28/22 (!) 164/94     Hydrochlorothiazide 25 mg daily  Discussed lifestyle change and DASH eating plan at last visit   Does not check at home   Humerus fracture- healing well  Going to PT still   Some bands   Eating less processed carbs and sugar   At home has lost 25 lb (same scale)  214.1   Was up to 235 at highest     Lab Results  Component Value Date   NA 140 01/12/2023   K 4.0 01/12/2023   CO2 30 01/12/2023   GLUCOSE 90 01/12/2023   BUN 23 01/12/2023   CREATININE 0.54 01/12/2023   CALCIUM 9.2 01/12/2023   GFR 94.51 01/12/2023   GFRNONAA 112.18 03/08/2010   Lab Results  Component Value Date   CHOL 228 (H) 11/20/2022   HDL 71.10 11/20/2022   LDLCALC 137 (H) 11/20/2022   LDLDIRECT 130.7 08/05/2010   TRIG 95.0 11/20/2022   CHOLHDL 3 11/20/2022   Lab Results  Component Value Date   ALT 14 11/20/2022   AST 15 11/20/2022   ALKPHOS 53 11/20/2022   BILITOT 0.4 11/20/2022       Patient Active Problem List   Diagnosis Date Noted   History of humerus fracture 01/12/2023   Primary hypertension 12/28/2022   Snoring 02/02/2022   Medicare annual wellness visit, subsequent 10/31/2021   Encounter for screening mammogram for breast cancer 10/31/2021   Colon cancer screening 09/21/2020   Elevated glucose 09/21/2020   Welcome to Medicare preventive visit 07/23/2019   Hydradenitis 05/14/2017    Depression, major, recurrent (HCC) 02/25/2016   Family history of leukemia 03/18/2014   Encounter for routine gynecological examination 09/20/2012   Routine general medical examination at a health care facility 09/16/2012   OSA (obstructive sleep apnea) 02/16/2010   Class 2 obesity due to excess calories with body mass index (BMI) of 38.0 to 38.9 in adult 12/30/2009   Pure hypercholesterolemia 03/03/2009   Osteoporosis 05/20/2008   Disorder of bone and cartilage 05/20/2008   Past Medical History:  Diagnosis Date   Hyperlipidemia    Osteopenia    Primary hypertension 12/28/2022   Sleep apnea    Past Surgical History:  Procedure Laterality Date   CESAREAN SECTION  10/28/1985   Social History   Tobacco Use   Smoking status: Never   Smokeless tobacco: Never  Vaping Use   Vaping status: Never Used  Substance Use Topics   Alcohol use: Yes    Comment: Occasionally   Drug use: No   Family History  Problem Relation Age of Onset   Bladder Cancer Brother    Cancer Mother    Cancer Father    Arthritis Brother    Cancer Brother  Hearing loss Brother    Breast cancer Neg Hx    No Known Allergies Current Outpatient Medications on File Prior to Visit  Medication Sig Dispense Refill   alendronate (FOSAMAX) 70 MG tablet Take 1 tablet (70 mg total) by mouth every 7 (seven) days. TAKE WITH A FULL GLASS OF WATER ON AN EMPTY STOMACH. 12 tablet 3   buPROPion (WELLBUTRIN XL) 150 MG 24 hr tablet Take 1 tablet (150 mg total) by mouth daily. In am 90 tablet 3   cholecalciferol (VITAMIN D3) 25 MCG (1000 UNIT) tablet Take 2,000 Units by mouth daily.     Multiple Vitamins-Minerals (MULTIVITAMIN WITH MINERALS) tablet Take 1 tablet by mouth daily.     sertraline (ZOLOFT) 100 MG tablet Take 1 tablet (100 mg total) by mouth at bedtime. 90 tablet 3   Zoster Vaccine Adjuvanted [facility]) injection Inject into the muscle. 0.5 mL 0   No current facility-administered medications on file prior to  visit.    Review of Systems  Constitutional:  Negative for activity change, appetite change, fatigue, fever and unexpected weight change.  HENT:  Negative for congestion, ear pain, rhinorrhea, sinus pressure and sore throat.   Eyes:  Negative for pain, redness and visual disturbance.  Respiratory:  Negative for cough, shortness of breath and wheezing.   Cardiovascular:  Negative for chest pain and palpitations.  Gastrointestinal:  Negative for abdominal pain, blood in stool, constipation and diarrhea.  Endocrine: Negative for polydipsia and polyuria.  Genitourinary:  Negative for dysuria, frequency and urgency.  Musculoskeletal:  Negative for arthralgias, back pain and myalgias.  Skin:  Negative for pallor and rash.  Allergic/Immunologic: Negative for environmental allergies.  Neurological:  Negative for dizziness, syncope and headaches.  Hematological:  Negative for adenopathy. Does not bruise/bleed easily.  Psychiatric/Behavioral:  Negative for decreased concentration and dysphoric mood. The patient is not nervous/anxious.        Objective:   Physical Exam Constitutional:      General: She is not in acute distress.    Appearance: Normal appearance. She is well-developed. She is obese. She is not ill-appearing or diaphoretic.  HENT:     Head: Normocephalic and atraumatic.     Mouth/Throat:     Mouth: Mucous membranes are moist.  Eyes:     General: No scleral icterus.    Conjunctiva/sclera: Conjunctivae normal.     Pupils: Pupils are equal, round, and reactive to light.  Neck:     Thyroid: No thyromegaly.     Vascular: No carotid bruit or JVD.  Cardiovascular:     Rate and Rhythm: Normal rate and regular rhythm.     Heart sounds: Normal heart sounds.     No gallop.  Pulmonary:     Effort: Pulmonary effort is normal. No respiratory distress.     Breath sounds: Normal breath sounds. No wheezing or rales.  Abdominal:     General: There is no distension or abdominal bruit.      Palpations: Abdomen is soft.  Musculoskeletal:     Cervical back: Normal range of motion and neck supple.     Right lower leg: No edema.     Left lower leg: No edema.  Lymphadenopathy:     Cervical: No cervical adenopathy.  Skin:    General: Skin is warm and dry.     Coloration: Skin is not pale.     Findings: No rash.  Neurological:     Mental Status: She is alert.     Coordination:  Coordination normal.     Deep Tendon Reflexes: Reflexes are normal and symmetric. Reflexes normal.  Psychiatric:        Mood and Affect: Mood normal.           Assessment & Plan:   Problem List Items Addressed This Visit       Cardiovascular and Mediastinum   Primary hypertension - Primary    bp in fair control at this time  BP Readings from Last 1 Encounters:  03/14/23 132/80   No changes needed Plan to continue hydrochlorothiazide 25 mg daily  Most recent labs reviewed  Disc lifstyle change with low sodium diet and exercise -doing well with this        Relevant Medications   hydrochlorothiazide (HYDRODIURIL) 25 MG tablet     Other   History of humerus fracture    Continues PT  Gradually improving  Discussed plan for exercise when released       Pure hypercholesterolemia    Disc goals for lipids and reasons to control them Rev last labs with pt Rev low sat fat diet in detail HDL is down a it  Stable LDL -consider statin in future if this rises         Relevant Medications   hydrochlorothiazide (HYDRODIURIL) 25 MG tablet

## 2023-03-14 NOTE — Patient Instructions (Addendum)
Continue the hydrochlorothiazide for blood pressure   Goal is 5 days per week at least 30 minutes  Walking  Add some strength training to your routine, this is important for bone and brain health and can reduce your risk of falls and help your body use insulin properly and regulate weight  Light weights, exercise bands , and internet videos are a good way to start  Yoga (chair or regular), machines , floor exercises or a gym with machines are also good options    Keep up the good work with less processed foods   Take care of yourself

## 2023-03-14 NOTE — Assessment & Plan Note (Signed)
Disc goals for lipids and reasons to control them Rev last labs with pt Rev low sat fat diet in detail HDL is down a it  Stable LDL -consider statin in future if this rises

## 2023-03-16 DIAGNOSIS — S42252D Displaced fracture of greater tuberosity of left humerus, subsequent encounter for fracture with routine healing: Secondary | ICD-10-CM | POA: Diagnosis not present

## 2023-03-19 DIAGNOSIS — G4733 Obstructive sleep apnea (adult) (pediatric): Secondary | ICD-10-CM | POA: Diagnosis not present

## 2023-03-29 DIAGNOSIS — G4733 Obstructive sleep apnea (adult) (pediatric): Secondary | ICD-10-CM | POA: Diagnosis not present

## 2023-04-02 DIAGNOSIS — S42252D Displaced fracture of greater tuberosity of left humerus, subsequent encounter for fracture with routine healing: Secondary | ICD-10-CM | POA: Diagnosis not present

## 2023-04-09 ENCOUNTER — Other Ambulatory Visit: Payer: Self-pay

## 2023-04-09 DIAGNOSIS — S42252D Displaced fracture of greater tuberosity of left humerus, subsequent encounter for fracture with routine healing: Secondary | ICD-10-CM | POA: Diagnosis not present

## 2023-04-18 DIAGNOSIS — G4733 Obstructive sleep apnea (adult) (pediatric): Secondary | ICD-10-CM | POA: Diagnosis not present

## 2023-05-19 DIAGNOSIS — G4733 Obstructive sleep apnea (adult) (pediatric): Secondary | ICD-10-CM | POA: Diagnosis not present

## 2023-06-14 ENCOUNTER — Other Ambulatory Visit: Payer: Self-pay

## 2023-06-14 ENCOUNTER — Other Ambulatory Visit: Payer: Self-pay | Admitting: Internal Medicine

## 2023-06-14 MED ORDER — SHINGRIX 50 MCG/0.5ML IM SUSR
0.5000 mL | Freq: Once | INTRAMUSCULAR | 0 refills | Status: AC
  Filled 2023-06-14 – 2023-11-21 (×2): qty 0.5, 1d supply, fill #0

## 2023-06-19 DIAGNOSIS — G4733 Obstructive sleep apnea (adult) (pediatric): Secondary | ICD-10-CM | POA: Diagnosis not present

## 2023-06-27 ENCOUNTER — Other Ambulatory Visit: Payer: Self-pay

## 2023-06-27 DIAGNOSIS — D2271 Melanocytic nevi of right lower limb, including hip: Secondary | ICD-10-CM | POA: Diagnosis not present

## 2023-06-27 DIAGNOSIS — D225 Melanocytic nevi of trunk: Secondary | ICD-10-CM | POA: Diagnosis not present

## 2023-06-27 DIAGNOSIS — D2272 Melanocytic nevi of left lower limb, including hip: Secondary | ICD-10-CM | POA: Diagnosis not present

## 2023-06-27 DIAGNOSIS — L820 Inflamed seborrheic keratosis: Secondary | ICD-10-CM | POA: Diagnosis not present

## 2023-06-27 DIAGNOSIS — D2262 Melanocytic nevi of left upper limb, including shoulder: Secondary | ICD-10-CM | POA: Diagnosis not present

## 2023-06-27 DIAGNOSIS — L2989 Other pruritus: Secondary | ICD-10-CM | POA: Diagnosis not present

## 2023-06-27 DIAGNOSIS — L821 Other seborrheic keratosis: Secondary | ICD-10-CM | POA: Diagnosis not present

## 2023-06-27 DIAGNOSIS — D2261 Melanocytic nevi of right upper limb, including shoulder: Secondary | ICD-10-CM | POA: Diagnosis not present

## 2023-06-27 DIAGNOSIS — L538 Other specified erythematous conditions: Secondary | ICD-10-CM | POA: Diagnosis not present

## 2023-07-09 ENCOUNTER — Encounter: Payer: Self-pay | Admitting: Family Medicine

## 2023-07-09 NOTE — Telephone Encounter (Signed)
 I cannot treat without a visit  Radicular pain from a disk will usually be one sided   If out of town I recommend UC so she can be examined

## 2023-07-17 DIAGNOSIS — G4733 Obstructive sleep apnea (adult) (pediatric): Secondary | ICD-10-CM | POA: Diagnosis not present

## 2023-08-17 DIAGNOSIS — G4733 Obstructive sleep apnea (adult) (pediatric): Secondary | ICD-10-CM | POA: Diagnosis not present

## 2023-08-22 ENCOUNTER — Encounter: Payer: Self-pay | Admitting: Family Medicine

## 2023-09-11 ENCOUNTER — Other Ambulatory Visit: Payer: Self-pay

## 2023-09-16 DIAGNOSIS — G4733 Obstructive sleep apnea (adult) (pediatric): Secondary | ICD-10-CM | POA: Diagnosis not present

## 2023-10-17 DIAGNOSIS — G4733 Obstructive sleep apnea (adult) (pediatric): Secondary | ICD-10-CM | POA: Diagnosis not present

## 2023-10-26 ENCOUNTER — Other Ambulatory Visit: Payer: Self-pay

## 2023-11-07 ENCOUNTER — Ambulatory Visit

## 2023-11-07 DIAGNOSIS — Z0000 Encounter for general adult medical examination without abnormal findings: Secondary | ICD-10-CM

## 2023-11-07 NOTE — Patient Instructions (Signed)
 Ms. Ciccone , Thank you for taking time out of your busy schedule to complete your Annual Wellness Visit with me. I enjoyed our conversation and look forward to speaking with you again next year. I, as well as your care team,  appreciate your ongoing commitment to your health goals. Please review the following plan we discussed and let me know if I can assist you in the future. Your Game plan/ To Do List    Referrals: If you haven't heard from the office you've been referred to, please reach out to them at the phone provided.  N/a Follow up Visits: Next Medicare AWV with our clinical staff: 11/10/2024 at 9:30   Have you seen your provider in the last 6 months (3 months if uncontrolled diabetes)? No Next Office Visit with your provider: will call to schedule  Clinician Recommendations:  Aim for 30 minutes of exercise or brisk walking, 6-8 glasses of water, and 5 servings of fruits and vegetables each day.       This is a list of the screening recommended for you and due dates:  Health Maintenance  Topic Date Due   Zoster (Shingles) Vaccine (2 of 2) 04/05/2023   COVID-19 Vaccine (5 - 2024-25 season) 03/29/2025*   Mammogram  11/29/2023   Flu Shot  12/07/2023   DTaP/Tdap/Td vaccine (3 - Td or Tdap) 03/18/2024   Medicare Annual Wellness Visit  11/06/2024   DEXA scan (bone density measurement)  11/16/2024   Colon Cancer Screening  06/03/2028   Pneumococcal Vaccine for age over 23  Completed   Hepatitis C Screening  Completed   Hepatitis B Vaccine  Aged Out   HPV Vaccine  Aged Out   Meningitis B Vaccine  Aged Out  *Topic was postponed. The date shown is not the original due date.    Advanced directives: (Copy Requested) Please bring a copy of your health care power of attorney and living will to the office to be added to your chart at your convenience. You can mail to [facility] [address] [place], [place] [address] or email to [email or web address] Advance Care  Planning is important because it:  [x]  Makes sure you receive the medical care that is consistent with your values, goals, and preferences  [x]  It provides guidance to your family and loved ones and reduces their decisional burden about whether or not they are making the right decisions based on your wishes.  Follow the link provided in your after visit summary or read over the paperwork we have mailed to you to help you started getting your Advance Directives in place. If you need assistance in completing these, please reach out to us  so that we can help you!  See attachments for Preventive Care and Fall Prevention Tips.

## 2023-11-07 NOTE — Progress Notes (Signed)
 Subjective:   Emma Knight is a 68 y.o. who presents for a Medicare Wellness preventive visit.  As a reminder, Annual Wellness Visits don't include a physical exam, and some assessments may be limited, especially if this visit is performed virtually. We may recommend an in-person follow-up visit with your provider if needed.  Visit Complete: Virtual I connected with  Emma Knight on 11/07/23 by a audio enabled telemedicine application and verified that I am speaking with the correct person using two identifiers.  Patient Location: Home  Provider Location: Office/Clinic  I discussed the limitations of evaluation and management by telemedicine. The patient expressed understanding and agreed to proceed.  Vital Signs: Because this visit was a virtual/telehealth visit, some criteria may be missing or patient reported. Any vitals not documented were not able to be obtained and vitals that have been documented are patient reported.  VideoError- Librarian, academic were attempted between this provider and patient, however failed, due to patient having technical difficulties OR patient did not have access to video capability.  We continued and completed visit with audio only.   Persons Participating in Visit: Patient.  AWV Questionnaire: Yes: Patient Medicare AWV questionnaire was completed by the patient on 11/03/2023; I have confirmed that all information answered by patient is correct and no changes since this date.  Cardiac Risk Factors include: advanced age (>45men, >53 women);hypertension     Objective:    Today's Vitals   There is no height or weight on file to calculate BMI.     11/07/2023    9:24 AM 09/06/2022   10:33 AM 09/15/2021   11:02 AM 09/14/2020   11:23 AM  Advanced Directives  Does Patient Have a Medical Advance Directive? Yes Yes No Yes  Type of Estate agent of [place];Living will Healthcare Power of [place];Living  will  Healthcare Power of [place];Living will  Copy of Healthcare Power of Attorney in Chart? No - copy requested No - copy requested  No - copy requested  Would patient like information on creating a medical advance directive?   No - Patient declined     Current Medications (verified) Outpatient Encounter Medications as of 11/07/2023  Medication Sig   alendronate  (FOSAMAX ) 70 MG tablet Take 1 tablet (70 mg total) by mouth every 7 (seven) days. TAKE WITH A FULL GLASS OF WATER ON AN EMPTY STOMACH.   buPROPion  (WELLBUTRIN  XL) 150 MG 24 hr tablet Take 1 tablet (150 mg total) by mouth daily. In am   cholecalciferol (VITAMIN D3) 25 MCG (1000 UNIT) tablet Take 2,000 Units by mouth daily.   hydrochlorothiazide  (HYDRODIURIL ) 25 MG tablet Take 1 tablet (25 mg total) by mouth daily.   Multiple Vitamins-Minerals (MULTIVITAMIN WITH MINERALS) tablet Take 1 tablet by mouth daily.   sertraline  (ZOLOFT ) 100 MG tablet Take 1 tablet (100 mg total) by mouth at bedtime.   No facility-administered encounter medications on file as of 11/07/2023.    Allergies (verified) Patient has no known allergies.   History: Past Medical History:  Diagnosis Date   Hyperlipidemia    Osteopenia    Primary hypertension 12/28/2022   Sleep apnea    Past Surgical History:  Procedure Laterality Date   CESAREAN SECTION  10/28/1985   SHOULDER SURGERY Left    12/2022   Family History  Problem Relation Age of Onset   Bladder Cancer Brother    Cancer Mother    Cancer Father    Arthritis Brother  Cancer Brother    Hearing loss Brother    Breast cancer Neg Hx    Social History   Socioeconomic History   Marital status: Divorced    Spouse name: Not on file   Number of children: Not on file   Years of education: Not on file   Highest education level: Bachelor's degree (e.g., BA, AB, BS)  Occupational History   Not on file  Tobacco Use   Smoking status: Never   Smokeless tobacco: Never  Vaping Use   Vaping status:  Never Used  Substance and Sexual Activity   Alcohol use: Yes    Comment: Occasionally   Drug use: No   Sexual activity: Not Currently    Birth control/protection: None  Other Topics Concern   Not on file  Social History Narrative   Not on file   Social Drivers of Health   Financial Resource Strain: Low Risk  (11/03/2023)   Overall Financial Resource Strain (CARDIA)    Difficulty of Paying Living Expenses: Not hard at all  Food Insecurity: No Food Insecurity (11/03/2023)   Hunger Vital Sign    Worried About Running Out of Food in the Last Year: Never true    Ran Out of Food in the Last Year: Never true  Transportation Needs: No Transportation Needs (11/03/2023)   PRAPARE - Administrator, Civil Service (Medical): No    Lack of Transportation (Non-Medical): No  Physical Activity: Insufficiently Active (11/03/2023)   Exercise Vital Sign    Days of Exercise per Week: 1 day    Minutes of Exercise per Session: 20 min  Stress: No Stress Concern Present (11/03/2023)   [employer] of Occupational Health - Occupational Stress Questionnaire    Feeling of Stress: Not at all  Social Connections: Moderately Integrated (11/03/2023)   Social Connection and Isolation Panel    Frequency of Communication with Friends and Family: More than three times a week    Frequency of Social Gatherings with Friends and Family: Once a week    Attends Religious Services: More than 4 times per year    Active Member of [employer] or Organizations: Yes    Attends Engineer, structural: More than 4 times per year    Marital Status: Divorced    Tobacco Counseling Counseling given: Not Answered    Clinical Intake:  Pre-visit preparation completed: Yes  Pain : No/denies pain     Nutritional Risks: None Diabetes: No  Lab Results  Component Value Date   HGBA1C 5.3 11/20/2022   HGBA1C 5.4 02/02/2022   HGBA1C 5.3 11/22/2020     How often do you need to have someone help you when  you read instructions, pamphlets, or other written materials from your doctor or pharmacy?: 1 - Never  Interpreter Needed?: No  Information entered by :: NAllen LPN   Activities of Daily Living     11/03/2023   11:01 AM 09/10/2023    1:33 PM  In your present state of health, do you have any difficulty performing the following activities:  Hearing? 0 0  Vision? 0 0  Difficulty concentrating or making decisions? 0 0  Walking or climbing stairs? 0 0  Dressing or bathing? 0 0  Doing errands, shopping? 0 0  Preparing Food and eating ? N N  Using the Toilet? N N  In the past six months, have you accidently leaked urine? N N  Do you have problems with loss of bowel control? N N  Managing your Medications? N N  Managing your Finances? N N  Housekeeping or managing your Housekeeping? N N    Patient Care Team: [place], Laine LABOR, MD as PCP - General  I have updated your Care Teams any recent Medical Services you may have received from other providers in the past year.     Assessment:   This is a routine wellness examination for Emma Knight.  Hearing/Vision screen Hearing Screening - Comments:: Denies hearing issues Vision Screening - Comments:: Regular eye exams, [facility]   Goals Addressed             This Visit's Progress    Patient Stated       11/07/2023, wants to get more active and lose weight       Depression Screen     11/07/2023    9:26 AM 03/14/2023   10:02 AM 12/28/2022   11:56 AM 09/06/2022   10:39 AM 10/31/2021    9:58 AM 09/15/2021   11:01 AM 09/14/2020   11:27 AM  PHQ 2/9 Scores  PHQ - 2 Score 0 0 0 0 0 0 0  PHQ- 9 Score 0 0     0    Fall Risk     11/03/2023   11:01 AM 09/10/2023    1:33 PM 03/14/2023   10:02 AM 12/28/2022   11:56 AM 09/06/2022   10:32 AM  Fall Risk   Falls in the past year? 1 1 1  0 0  Comment fell going up steps      Number falls in past yr: 0 0 0 0 0  Injury with Fall? 1 1 1  0 0  Comment broke shoulder      Risk for fall due to :  Medication side effect  History of fall(s) No Fall Risks No Fall Risks  Follow up Falls prevention discussed;Falls evaluation completed  Falls evaluation completed Falls evaluation completed Falls prevention discussed    MEDICARE RISK AT HOME:  Medicare Risk at Home Any stairs in or around the home?: (Patient-Rptd) No If so, are there any without handrails?: (Patient-Rptd) No Home free of loose throw rugs in walkways, pet beds, electrical cords, etc?: (Patient-Rptd) Yes Adequate lighting in your home to reduce risk of falls?: (Patient-Rptd) Yes Life alert?: (Patient-Rptd) No Use of a cane, walker or w/c?: (Patient-Rptd) No Grab bars in the bathroom?: (Patient-Rptd) No Shower chair or bench in shower?: (Patient-Rptd) Yes Elevated toilet seat or a handicapped toilet?: (Patient-Rptd) No  TIMED UP AND GO:  Was the test performed?  No  Cognitive Function: 6CIT completed    09/14/2020   11:28 AM  MMSE - Mini Mental State Exam  Not completed: Refused        11/07/2023    9:27 AM 09/06/2022   10:41 AM 09/15/2021   11:05 AM  6CIT Screen  What Year? 0 points 0 points 0 points  What month? 0 points 0 points 0 points  What time? 0 points 0 points 0 points  Count back from 20 0 points 0 points 0 points  Months in reverse 0 points 0 points 0 points  Repeat phrase 0 points 2 points 0 points  Total Score 0 points 2 points 0 points    Immunizations Immunization History  Administered Date(s) Administered   Fluad  Quad(high Dose 65+) 02/26/2021, 02/02/2022   Fluad  Trivalent(High Dose 65+) 01/12/2023   Influenza Inj Mdck Quad With Preservative 02/23/2018   Influenza Whole 01/25/2009, 02/26/2021   Influenza,inj,Quad PF,6+ Mos 03/06/2014,  02/25/2016   Influenza-Unspecified 01/21/2015, 02/04/2017, 01/21/2019   PFIZER(Purple Top)SARS-COV-2 Vaccination 05/20/2019, 06/10/2019, 02/27/2020   Pfizer(Comirnaty )Fall Seasonal Vaccine 12 years and older 04/11/2022   Pneumococcal Conjugate-13 07/23/2019    Pneumococcal Polysaccharide-23 09/21/2020   Td 05/09/2003   Tdap 03/18/2014   Zoster Recombinant(Shingrix ) 02/08/2023    Screening Tests Health Maintenance  Topic Date Due   Zoster Vaccines- Shingrix  (2 of 2) 04/05/2023   COVID-19 Vaccine (5 - 2024-25 season) 03/29/2025 (Originally 01/07/2023)   MAMMOGRAM  11/29/2023   INFLUENZA VACCINE  12/07/2023   DTaP/Tdap/Td (3 - Td or Tdap) 03/18/2024   Medicare Annual Wellness (AWV)  11/06/2024   DEXA SCAN  11/16/2024   Colonoscopy  06/03/2028   Pneumococcal Vaccine: 50+ Years  Completed   Hepatitis C Screening  Completed   Hepatitis B Vaccines  Aged Out   HPV VACCINES  Aged Out   Meningococcal B Vaccine  Aged Out    Health Maintenance  Health Maintenance Due  Topic Date Due   Zoster Vaccines- Shingrix  (2 of 2) 04/05/2023   Health Maintenance Items Addressed: Due for second shingles vaccine  Additional Screening:  Vision Screening: Recommended annual ophthalmology exams for early detection of glaucoma and other disorders of the eye. Would you like a referral to an eye doctor? No    Dental Screening: Recommended annual dental exams for proper oral hygiene  Community Resource Referral / Chronic Care Management: CRR required this visit?  No   CCM required this visit?  No   Plan:    I have personally reviewed and noted the following in the patient's chart:   Medical and social history Use of alcohol, tobacco or illicit drugs  Current medications and supplements including opioid prescriptions. Patient is not currently taking opioid prescriptions. Functional ability and status Nutritional status Physical activity Advanced directives List of other physicians Hospitalizations, surgeries, and ER visits in previous 12 months Vitals Screenings to include cognitive, depression, and falls Referrals and appointments  In addition, I have reviewed and discussed with patient certain preventive protocols, quality metrics, and  best practice recommendations. A written personalized care plan for preventive services as well as general preventive health recommendations were provided to patient.   Emma FORBES Dawn, LPN   06/14/7972   After Visit Summary: (MyChart) Due to this being a telephonic visit, the after visit summary with patients personalized plan was offered to patient via MyChart   Notes: Nothing significant to report at this time.

## 2023-11-16 DIAGNOSIS — G4733 Obstructive sleep apnea (adult) (pediatric): Secondary | ICD-10-CM | POA: Diagnosis not present

## 2023-11-21 ENCOUNTER — Other Ambulatory Visit: Payer: Self-pay

## 2023-12-10 ENCOUNTER — Other Ambulatory Visit: Payer: Self-pay

## 2024-01-06 ENCOUNTER — Other Ambulatory Visit: Payer: Self-pay | Admitting: Family Medicine

## 2024-01-08 ENCOUNTER — Other Ambulatory Visit: Payer: Self-pay

## 2024-01-08 ENCOUNTER — Other Ambulatory Visit: Payer: Self-pay | Admitting: Family Medicine

## 2024-01-10 ENCOUNTER — Other Ambulatory Visit: Payer: Self-pay

## 2024-01-10 MED FILL — Bupropion HCl Tab ER 24HR 150 MG: ORAL | 90 days supply | Qty: 90 | Fill #0 | Status: AC

## 2024-01-10 NOTE — Telephone Encounter (Signed)
 Pt is overdue for her CPE (labs prior), please schedule and I will refill med once

## 2024-01-11 ENCOUNTER — Encounter: Payer: Self-pay | Admitting: Family Medicine

## 2024-01-11 ENCOUNTER — Other Ambulatory Visit: Payer: Self-pay

## 2024-01-11 NOTE — Telephone Encounter (Signed)
 I called patient and she has been scheduled.

## 2024-01-29 ENCOUNTER — Other Ambulatory Visit: Payer: Self-pay | Admitting: Family Medicine

## 2024-01-29 ENCOUNTER — Other Ambulatory Visit: Payer: Self-pay

## 2024-01-29 DIAGNOSIS — F330 Major depressive disorder, recurrent, mild: Secondary | ICD-10-CM

## 2024-01-29 MED ORDER — SERTRALINE HCL 100 MG PO TABS
100.0000 mg | ORAL_TABLET | Freq: Every day | ORAL | 0 refills | Status: AC
Start: 2024-01-29 — End: ?
  Filled 2024-01-29: qty 90, 90d supply, fill #0

## 2024-01-29 MED ORDER — FLUZONE HIGH-DOSE 0.5 ML IM SUSY
0.5000 mL | PREFILLED_SYRINGE | Freq: Once | INTRAMUSCULAR | 0 refills | Status: AC
  Filled 2024-01-29: qty 0.5, 1d supply, fill #0

## 2024-01-30 ENCOUNTER — Other Ambulatory Visit: Payer: Self-pay

## 2024-01-30 NOTE — Telephone Encounter (Signed)
 Noted

## 2024-02-11 DIAGNOSIS — G4733 Obstructive sleep apnea (adult) (pediatric): Secondary | ICD-10-CM | POA: Diagnosis not present

## 2024-02-12 ENCOUNTER — Telehealth: Payer: Self-pay | Admitting: Family Medicine

## 2024-02-12 DIAGNOSIS — M810 Age-related osteoporosis without current pathological fracture: Secondary | ICD-10-CM

## 2024-02-12 DIAGNOSIS — E7800 Pure hypercholesterolemia, unspecified: Secondary | ICD-10-CM

## 2024-02-12 DIAGNOSIS — R7309 Other abnormal glucose: Secondary | ICD-10-CM

## 2024-02-12 DIAGNOSIS — I10 Essential (primary) hypertension: Secondary | ICD-10-CM

## 2024-02-12 NOTE — Telephone Encounter (Signed)
-----   Message from Harlene Du sent at 01/31/2024  3:12 PM EDT ----- Regarding: Lab Thurs 02/14/24 Hello,  Patient is coming in for CPE labs on Thursday 02/14/24. Can we get orders please.   Thanks

## 2024-02-14 ENCOUNTER — Other Ambulatory Visit

## 2024-02-21 ENCOUNTER — Encounter: Admitting: Family Medicine

## 2024-03-05 ENCOUNTER — Other Ambulatory Visit (INDEPENDENT_AMBULATORY_CARE_PROVIDER_SITE_OTHER)

## 2024-03-05 ENCOUNTER — Ambulatory Visit: Payer: Self-pay | Admitting: Family Medicine

## 2024-03-05 DIAGNOSIS — R7309 Other abnormal glucose: Secondary | ICD-10-CM

## 2024-03-05 DIAGNOSIS — E7800 Pure hypercholesterolemia, unspecified: Secondary | ICD-10-CM | POA: Diagnosis not present

## 2024-03-05 DIAGNOSIS — I10 Essential (primary) hypertension: Secondary | ICD-10-CM

## 2024-03-05 DIAGNOSIS — M810 Age-related osteoporosis without current pathological fracture: Secondary | ICD-10-CM | POA: Diagnosis not present

## 2024-03-05 LAB — CBC WITH DIFFERENTIAL/PLATELET
Basophils Absolute: 0 K/uL (ref 0.0–0.1)
Basophils Relative: 0.5 % (ref 0.0–3.0)
Eosinophils Absolute: 0.1 K/uL (ref 0.0–0.7)
Eosinophils Relative: 1.5 % (ref 0.0–5.0)
HCT: 38 % (ref 36.0–46.0)
Hemoglobin: 12.8 g/dL (ref 12.0–15.0)
Lymphocytes Relative: 38.1 % (ref 12.0–46.0)
Lymphs Abs: 1.3 K/uL (ref 0.7–4.0)
MCHC: 33.8 g/dL (ref 30.0–36.0)
MCV: 91.6 fl (ref 78.0–100.0)
Monocytes Absolute: 0.3 K/uL (ref 0.1–1.0)
Monocytes Relative: 9.1 % (ref 3.0–12.0)
Neutro Abs: 1.7 K/uL (ref 1.4–7.7)
Neutrophils Relative %: 50.8 % (ref 43.0–77.0)
Platelets: 213 K/uL (ref 150.0–400.0)
RBC: 4.15 Mil/uL (ref 3.87–5.11)
RDW: 13.8 % (ref 11.5–15.5)
WBC: 3.4 K/uL — ABNORMAL LOW (ref 4.0–10.5)

## 2024-03-05 LAB — COMPREHENSIVE METABOLIC PANEL WITH GFR
ALT: 17 U/L (ref 0–35)
AST: 16 U/L (ref 0–37)
Albumin: 4.1 g/dL (ref 3.5–5.2)
Alkaline Phosphatase: 53 U/L (ref 39–117)
BUN: 21 mg/dL (ref 6–23)
CO2: 30 meq/L (ref 19–32)
Calcium: 9 mg/dL (ref 8.4–10.5)
Chloride: 104 meq/L (ref 96–112)
Creatinine, Ser: 0.65 mg/dL (ref 0.40–1.20)
GFR: 89.66 mL/min (ref >60.00)
Glucose, Bld: 94 mg/dL (ref 70–99)
Potassium: 4.4 meq/L (ref 3.5–5.1)
Sodium: 140 meq/L (ref 135–145)
Total Bilirubin: 0.5 mg/dL (ref 0.2–1.2)
Total Protein: 6.3 g/dL (ref 6.0–8.3)

## 2024-03-05 LAB — LIPID PANEL
Cholesterol: 229 mg/dL — ABNORMAL HIGH (ref 0–200)
HDL: 77.5 mg/dL (ref >39.00)
LDL Cholesterol: 140 mg/dL — ABNORMAL HIGH (ref 0–99)
NonHDL: 151.75
Total CHOL/HDL Ratio: 3
Triglycerides: 60 mg/dL (ref 0.0–149.0)
VLDL: 12 mg/dL (ref 0.0–40.0)

## 2024-03-05 LAB — VITAMIN D 25 HYDROXY (VIT D DEFICIENCY, FRACTURES): VITD: 25.9 ng/mL — ABNORMAL LOW (ref 30.00–100.00)

## 2024-03-05 LAB — HEMOGLOBIN A1C: Hgb A1c MFr Bld: 5.4 % (ref 4.6–6.5)

## 2024-03-05 LAB — TSH: TSH: 2.03 u[IU]/mL (ref 0.35–5.50)

## 2024-03-12 ENCOUNTER — Encounter: Payer: Self-pay | Admitting: Family Medicine

## 2024-03-12 ENCOUNTER — Ambulatory Visit (INDEPENDENT_AMBULATORY_CARE_PROVIDER_SITE_OTHER): Admitting: Family Medicine

## 2024-03-12 VITALS — BP 124/76 | HR 74 | Temp 98.6°F | Ht 63.5 in | Wt 211.0 lb

## 2024-03-12 DIAGNOSIS — E7800 Pure hypercholesterolemia, unspecified: Secondary | ICD-10-CM | POA: Diagnosis not present

## 2024-03-12 DIAGNOSIS — G4733 Obstructive sleep apnea (adult) (pediatric): Secondary | ICD-10-CM | POA: Diagnosis not present

## 2024-03-12 DIAGNOSIS — F330 Major depressive disorder, recurrent, mild: Secondary | ICD-10-CM

## 2024-03-12 DIAGNOSIS — Z806 Family history of leukemia: Secondary | ICD-10-CM

## 2024-03-12 DIAGNOSIS — R7309 Other abnormal glucose: Secondary | ICD-10-CM | POA: Diagnosis not present

## 2024-03-12 DIAGNOSIS — Z1231 Encounter for screening mammogram for malignant neoplasm of breast: Secondary | ICD-10-CM

## 2024-03-12 DIAGNOSIS — Z1211 Encounter for screening for malignant neoplasm of colon: Secondary | ICD-10-CM

## 2024-03-12 DIAGNOSIS — M810 Age-related osteoporosis without current pathological fracture: Secondary | ICD-10-CM | POA: Diagnosis not present

## 2024-03-12 DIAGNOSIS — Z0000 Encounter for general adult medical examination without abnormal findings: Secondary | ICD-10-CM | POA: Diagnosis not present

## 2024-03-12 DIAGNOSIS — I10 Essential (primary) hypertension: Secondary | ICD-10-CM

## 2024-03-12 DIAGNOSIS — E559 Vitamin D deficiency, unspecified: Secondary | ICD-10-CM

## 2024-03-12 NOTE — Assessment & Plan Note (Signed)
 Disc goals for lipids and reasons to control them Rev last labs with pt Rev low sat fat diet in detail   LDL 140  ASCVD risk 10.3% Diet is fair  Given info on coronary ca score to consider in future

## 2024-03-12 NOTE — Assessment & Plan Note (Signed)
 Reviewed health habits including diet and exercise and skin cancer prevention Reviewed appropriate screening tests for age  Also reviewed health mt list, fam hx and immunization status , as well as social and family history   See HPI Labs reviewed and ordered Health Maintenance  Topic Date Due   Breast Cancer Screening  11/29/2023   COVID-19 Vaccine (5 - 2025-26 season) 03/28/2026*   DTaP/Tdap/Td vaccine (3 - Td or Tdap) 03/18/2024   Medicare Annual Wellness Visit  11/06/2024   DEXA scan (bone density measurement)  11/16/2024   Colon Cancer Screening  06/03/2028   Pneumococcal Vaccine for age over 82  Completed   Flu Shot  Completed   Hepatitis C Screening  Completed   Zoster (Shingles) Vaccine  Completed   Meningitis B Vaccine  Aged Out  *Topic was postponed. The date shown is not the original due date.    Mammogram ordered  Discussed fall prevention, supplements and exercise for bone density  PHQ improved Working on health habits and wtloss

## 2024-03-12 NOTE — Progress Notes (Signed)
 Subjective:    Patient ID: Emma Knight, female    DOB: Jan 03, 1956, 68 y.o.   MRN: [id number]  HPI  Here for health maintenance exam and to review chronic medical problems   Wt Readings from Last 3 Encounters:  03/12/24 211 lb (95.7 kg)  03/14/23 215 lb 6 oz (97.7 kg)  01/12/23 219 lb 8 oz (99.6 kg)   36.79 kg/m  Vitals:   03/12/24 0935  BP: 124/76  Pulse: 74  Temp: 98.6 F (37 C)  SpO2: 97%    Immunization History  Administered Date(s) Administered   Fluad  Quad(high Dose 65+) 02/26/2021, 02/02/2022   Fluad  Trivalent(High Dose 65+) 01/12/2023   INFLUENZA, HIGH DOSE SEASONAL PF 01/29/2024   Influenza Inj Mdck Quad With Preservative 02/23/2018   Influenza Whole 01/25/2009, 02/26/2021   Influenza,inj,Quad PF,6+ Mos 03/06/2014, 02/25/2016   Influenza-Unspecified 01/21/2015, 02/04/2017, 01/21/2019   PFIZER(Purple Top)SARS-COV-2 Vaccination 05/20/2019, 06/10/2019, 02/27/2020   Pfizer(Comirnaty )Fall Seasonal Vaccine 12 years and older 04/11/2022   Pneumococcal Conjugate-13 07/23/2019   Pneumococcal Polysaccharide-23 09/21/2020   Td 05/09/2003   Tdap 03/18/2014   Zoster Recombinant(Shingrix ) 02/08/2023, 11/21/2023    Health Maintenance Due  Topic Date Due   Mammogram  11/29/2023   Watching what she eats  Lost 20 lb so far Also walking  Tracking on an app   Did take zepound for a few months (outside practitioner) -but could not afford to continue it    Mammogram 11/2022  Self breast exam- no lumps   Gyn health  No problems    Colon cancer screening  Colonoscopy 05/2018 -normal   Bone health  Dexa 11/2022 Osteoporosis  Taking alendronate  since 07/2019 -goal of 5 years   Falls- none  Fractures- none new /has in the past  Supplements - just ordered 5000 international units D3  (was on ca with D3)  Also calcium  Last vitamin D  Lab Results  Component Value Date   VD25OH 25.90 (L) 03/05/2024    Exercise  Started walking for exercise 30 minutes  Has  weights and wants to start using them      Mood    11/07/2023    9:26 AM 03/14/2023   10:02 AM 12/28/2022   11:56 AM 09/06/2022   10:39 AM 10/31/2021    9:58 AM  Depression screen PHQ 2/9  Decreased Interest 0 0 0 0 0  Down, Depressed, Hopeless 0 0 0 0 0  PHQ - 2 Score 0 0 0 0 0  Altered sleeping 0 0     Tired, decreased energy 0 0     Change in appetite 0 0     Feeling bad or failure about yourself  0 0     Trouble concentrating 0 0     Moving slowly or fidgety/restless 0 0     Suicidal thoughts 0 0     PHQ-9 Score 0 0     Difficult doing work/chores Not difficult at all Not difficult at all      History of recurrent major depression  Wellbutrin  xl 150 mg daily  Sertraline  100 mg daily   Mood is improved    HTN bp is stable today  No cp or palpitations or headaches or edema  No side effects to medicines  BP Readings from Last 3 Encounters:  03/12/24 124/76  03/14/23 132/80  01/12/23 131/75      Lab Results  Component Value Date   NA 140 03/05/2024   K 4.4 03/05/2024   CO2 30  03/05/2024   GLUCOSE 94 03/05/2024   BUN 21 03/05/2024   CREATININE 0.65 03/05/2024   CALCIUM 9.0 03/05/2024   GFR 89.66 03/05/2024   GFRNONAA 112.18 03/08/2010   Hydrochlorothiazide  25 mg daily   OSA Cpap -is working  Uses it regularly  Goes to Dr Herminio    Glucose Lab Results  Component Value Date   HGBA1C 5.4 03/05/2024   HGBA1C 5.3 11/20/2022   HGBA1C 5.4 02/02/2022   Hyperlipidemia Lab Results  Component Value Date   CHOL 229 (H) 03/05/2024   CHOL 228 (H) 11/20/2022   CHOL 262 (H) 10/31/2021   Lab Results  Component Value Date   HDL 77.50 03/05/2024   HDL 71.10 11/20/2022   HDL 107.90 10/31/2021   Lab Results  Component Value Date   LDLCALC 140 (H) 03/05/2024   LDLCALC 137 (H) 11/20/2022   LDLCALC 134 (H) 10/31/2021   Lab Results  Component Value Date   TRIG 60.0 03/05/2024   TRIG 95.0 11/20/2022   TRIG 103.0 10/31/2021   Lab Results  Component  Value Date   CHOLHDL 3 03/05/2024   CHOLHDL 3 11/20/2022   CHOLHDL 2 10/31/2021   Lab Results  Component Value Date   LDLDIRECT 130.7 08/05/2010   LDLDIRECT 157.7 06/14/2010   LDLDIRECT 153.9 03/08/2010   The 10-year ASCVD risk score (Arnett DK, et al., 2019) is: 10.3%   Values used to calculate the score:     Age: 40 years     Clincally relevant sex: Female     Is Non-Hispanic African American: No     Diabetic: No     Tobacco smoker: No     Systolic Blood Pressure: 124 mmHg     Is BP treated: Yes     HDL Cholesterol: 77.5 mg/dL     Total Cholesterol: 229 mg/dL  Diet is fairly good  No fried foods Occational beef  No pork   Lab Results  Component Value Date   ALT 17 03/05/2024   AST 16 03/05/2024   ALKPHOS 53 03/05/2024   BILITOT 0.5 03/05/2024    Lab Results  Component Value Date   WBC 3.4 (L) 03/05/2024   HGB 12.8 03/05/2024   HCT 38.0 03/05/2024   MCV 91.6 03/05/2024   PLT 213.0 03/05/2024   Lab Results  Component Value Date   TSH 2.03 03/05/2024      Patient Active Problem List   Diagnosis Date Noted   Vitamin D  deficiency 03/12/2024   History of humerus fracture 01/12/2023   Primary hypertension 12/28/2022   Encounter for screening mammogram for breast cancer 10/31/2021   Colon cancer screening 09/21/2020   Elevated glucose 09/21/2020   Hydradenitis 05/14/2017   Depression, major, recurrent 02/25/2016   Family history of leukemia 03/18/2014   Encounter for routine gynecological examination 09/20/2012   Routine general medical examination at a health care facility 09/16/2012   OSA (obstructive sleep apnea) 02/16/2010   Morbid obesity (HCC) 12/30/2009   Pure hypercholesterolemia 03/03/2009   Osteoporosis 05/20/2008   Disorder of bone and cartilage 05/20/2008   Past Medical History:  Diagnosis Date   Hyperlipidemia    Osteopenia    Primary hypertension 12/28/2022   Sleep apnea 2024   Past Surgical History:  Procedure Laterality Date    CESAREAN SECTION  10/28/1985   FRACTURE SURGERY  2024   Humerus   SHOULDER SURGERY Left    12/2022   Social History   Tobacco Use   Smoking status: Never  Smokeless tobacco: Never  Vaping Use   Vaping status: Never Used  Substance Use Topics   Alcohol use: Not Currently    Comment: Occasionally   Drug use: No   Family History  Problem Relation Age of Onset   Bladder Cancer Brother    Cancer Mother    Cancer Father    Arthritis Brother    Cancer Brother    Hearing loss Brother    Breast cancer Neg Hx    No Known Allergies Current Outpatient Medications on File Prior to Visit  Medication Sig Dispense Refill   alendronate  (FOSAMAX ) 70 MG tablet Take 1 tablet (70 mg total) by mouth every 7 (seven) days. TAKE WITH A FULL GLASS OF WATER ON AN EMPTY STOMACH. 12 tablet 3   buPROPion  (WELLBUTRIN  XL) 150 MG 24 hr tablet Take 1 tablet (150 mg total) by mouth daily. In am 90 tablet 0   CALCIUM PO Take 1 capsule by mouth daily.     cholecalciferol (VITAMIN D3) 25 MCG (1000 UNIT) tablet Take 2,000 Units by mouth daily. (Patient taking differently: Take 5,000 Units by mouth daily.)     Cyanocobalamin (B-12) 500 MCG TABS Take 1 tablet by mouth daily.     hydrochlorothiazide  (HYDRODIURIL ) 25 MG tablet Take 1 tablet (25 mg total) by mouth daily. 90 tablet 3   Multiple Vitamins-Minerals (MULTIVITAMIN WITH MINERALS) tablet Take 1 tablet by mouth daily.     sertraline  (ZOLOFT ) 100 MG tablet Take 1 tablet (100 mg total) by mouth at bedtime. 90 tablet 0   No current facility-administered medications on file prior to visit.    Review of Systems  Constitutional:  Negative for activity change, appetite change, fatigue, fever and unexpected weight change.  HENT:  Negative for congestion, ear pain, rhinorrhea, sinus pressure and sore throat.   Eyes:  Negative for pain, redness and visual disturbance.  Respiratory:  Negative for cough, shortness of breath and wheezing.   Cardiovascular:  Negative  for chest pain and palpitations.  Gastrointestinal:  Negative for abdominal pain, blood in stool, constipation and diarrhea.  Endocrine: Negative for polydipsia and polyuria.  Genitourinary:  Negative for dysuria, frequency and urgency.  Musculoskeletal:  Positive for arthralgias. Negative for back pain and myalgias.  Skin:  Negative for pallor and rash.  Allergic/Immunologic: Negative for environmental allergies.  Neurological:  Negative for dizziness, syncope and headaches.  Hematological:  Negative for adenopathy. Does not bruise/bleed easily.  Psychiatric/Behavioral:  Negative for decreased concentration and dysphoric mood. The patient is not nervous/anxious.        Objective:   Physical Exam Constitutional:      General: She is not in acute distress.    Appearance: Normal appearance. She is well-developed. She is obese. She is not ill-appearing or diaphoretic.  HENT:     Head: Normocephalic and atraumatic.     Right Ear: Tympanic membrane, ear canal and external ear normal.     Left Ear: Tympanic membrane, ear canal and external ear normal.     Nose: Nose normal. No congestion.     Mouth/Throat:     Mouth: Mucous membranes are moist.     Pharynx: Oropharynx is clear. No posterior oropharyngeal erythema.  Eyes:     General: No scleral icterus.    Extraocular Movements: Extraocular movements intact.     Conjunctiva/sclera: Conjunctivae normal.     Pupils: Pupils are equal, round, and reactive to light.  Neck:     Thyroid: No thyromegaly.  Vascular: No carotid bruit or JVD.  Cardiovascular:     Rate and Rhythm: Normal rate and regular rhythm.     Pulses: Normal pulses.     Heart sounds: Normal heart sounds.     No gallop.  Pulmonary:     Effort: Pulmonary effort is normal. No respiratory distress.     Breath sounds: Normal breath sounds. No wheezing.     Comments: Good air exch Chest:     Chest wall: No tenderness.  Abdominal:     General: Bowel sounds are normal.  There is no distension or abdominal bruit.     Palpations: Abdomen is soft. There is no mass.     Tenderness: There is no abdominal tenderness.     Hernia: No hernia is present.  Genitourinary:    Comments: Breast exam: No mass, nodules, thickening, tenderness, bulging, retraction, inflamation, nipple discharge or skin changes noted.  No axillary or clavicular LA.     Musculoskeletal:        General: No tenderness. Normal range of motion.     Cervical back: Normal range of motion and neck supple. No rigidity. No muscular tenderness.     Right lower leg: No edema.     Left lower leg: No edema.     Comments: No kyphosis   Lymphadenopathy:     Cervical: No cervical adenopathy.  Skin:    General: Skin is warm and dry.     Coloration: Skin is not pale.     Findings: No erythema or rash.     Comments: Solar lentigines diffusely   Neurological:     Mental Status: She is alert. Mental status is at baseline.     Cranial Nerves: No cranial nerve deficit.     Motor: No abnormal muscle tone.     Coordination: Coordination normal.     Gait: Gait normal.     Deep Tendon Reflexes: Reflexes are normal and symmetric. Reflexes normal.  Psychiatric:        Mood and Affect: Mood normal.        Cognition and Memory: Cognition and memory normal.           Assessment & Plan:   Problem List Items Addressed This Visit       Cardiovascular and Mediastinum   Primary hypertension   bp in fair control at this time  BP Readings from Last 1 Encounters:  03/12/24 124/76   No changes needed Plan to continue hydrochlorothiazide  25 mg daily  Most recent labs reviewed  Disc lifstyle change with low sodium diet and exercise -doing well with this          Respiratory   OSA (obstructive sleep apnea)   Using cpap regularly  Working on weight loss         Musculoskeletal and Integument   Osteoporosis   Dexa 11/2022  Alendronate  since 07/2019 -plan 5 y course  No new falls or fractures  D  level is low-discussed supplementation-going up to 5000 international units daily       Relevant Medications   CALCIUM PO     Other   Vitamin D  deficiency   Last vitamin D  Lab Results  Component Value Date   VD25OH 25.90 (L) 03/05/2024   Starting higher dose of D3   5000 international units daily        Routine general medical examination at a health care facility - Primary   Reviewed health habits including diet and exercise and skin  cancer prevention Reviewed appropriate screening tests for age  Also reviewed health mt list, fam hx and immunization status , as well as social and family history   See HPI Labs reviewed and ordered Health Maintenance  Topic Date Due   Breast Cancer Screening  11/29/2023   COVID-19 Vaccine (5 - 2025-26 season) 03/28/2026*   DTaP/Tdap/Td vaccine (3 - Td or Tdap) 03/18/2024   Medicare Annual Wellness Visit  11/06/2024   DEXA scan (bone density measurement)  11/16/2024   Colon Cancer Screening  06/03/2028   Pneumococcal Vaccine for age over 41  Completed   Flu Shot  Completed   Hepatitis C Screening  Completed   Zoster (Shingles) Vaccine  Completed   Meningitis B Vaccine  Aged Out  *Topic was postponed. The date shown is not the original due date.    Mammogram ordered  Discussed fall prevention, supplements and exercise for bone density  PHQ improved Working on health habits and wtloss       Pure hypercholesterolemia   Disc goals for lipids and reasons to control them Rev last labs with pt Rev low sat fat diet in detail   LDL 140  ASCVD risk 10.3% Diet is fair  Given info on coronary ca score to consider in future       Morbid obesity (HCC)   Bmi over 36 with co morbidities-HTN,lipid, OSA Working hard on weight loss  Has lost weight  Did take several mo of GLP-1 then could no longer afford   Encouraged to keep up the momentum   Discussed how this problem influences overall health and the risks it imposes  Reviewed plan for  weight loss with lower calorie diet (via better food choices (lower glycemic and portion control) along with exercise building up to or more than 30 minutes 5 days per week including some aerobic activity and strength training         Family history of leukemia   Lab Results  Component Value Date   WBC 3.4 (L) 03/05/2024   HGB 12.8 03/05/2024   HCT 38.0 03/05/2024   MCV 91.6 03/05/2024   PLT 213.0 03/05/2024   Reassuring       Encounter for screening mammogram for breast cancer   Mammogram ordered Pt will call to schedule       Relevant Orders   MM 3D SCREENING MAMMOGRAM BILATERAL BREAST   Elevated glucose   Prediabetes  Lab Results  Component Value Date   HGBA1C 5.4 03/05/2024   HGBA1C 5.3 11/20/2022   HGBA1C 5.4 02/02/2022    disc imp of low glycemic diet and wt loss to prevent DM2        Depression, major, recurrent   Improved recently  Continues  Wellbutrin  xl 150 mg daily  Sertraline  100 mg daily  More motivated and trying to move more        Colon cancer screening   Colonoscopy 05/2018

## 2024-03-12 NOTE — Patient Instructions (Addendum)
 Keep walking  Add some strength training to your routine, this is important for bone and brain health and can reduce your risk of falls and help your body use insulin properly and regulate weight  Light weights, exercise bands , and internet videos are a good way to start  Yoga (chair or regular), machines , floor exercises or a gym with machines are also good options    For cholesterol Avoid red meat/ fried foods/ egg yolks/ fatty breakfast meats/ butter, cheese and high fat dairy/ and shellfish   Go up on vitamin D3 to 5000 international units daily      You have an order for:  [x]   3D Mammogram  []   Bone Density     Please call for appointment:   [x]   [facility]  [address] [place] [address]  [phone]  []   [facility])   [address]  [place], [place] [address]  [phone]  []   [facility]      [address] [place], [place]        [phone]         []   [facility]  [address] [place], [place]  [phone]  []  [facility]   [address]   [place], [place] [address]  [phone]  []  [facility]  [address] [place], [place] [address] [phone]    Make sure to wear two piece clothing  No lotions powders or deodorants the day of the appointment Make sure to bring picture ID and insurance card.  Bring list of medications you are currently taking including any supplements.   Schedule your screening mammogram through MyChart!   Select [facility] imaging sites can now be scheduled through MyChart.  Log into your MyChart account.  Go to 'Visit' (or 'Appointments' if  on mobile App) --> Schedule an  Appointment  Under 'Select a Reason for Visit' choose the Mammogram  Screening  option.  Complete the pre-visit questions  and select the time and place that  best fits your schedule

## 2024-03-12 NOTE — Assessment & Plan Note (Signed)
 Prediabetes  Lab Results  Component Value Date   HGBA1C 5.4 03/05/2024   HGBA1C 5.3 11/20/2022   HGBA1C 5.4 02/02/2022    disc imp of low glycemic diet and wt loss to prevent DM2

## 2024-03-12 NOTE — Assessment & Plan Note (Signed)
 Mammogram ordered Pt will call to schedule

## 2024-03-12 NOTE — Assessment & Plan Note (Signed)
 Lab Results  Component Value Date   WBC 3.4 (L) 03/05/2024   HGB 12.8 03/05/2024   HCT 38.0 03/05/2024   MCV 91.6 03/05/2024   PLT 213.0 03/05/2024   Reassuring

## 2024-03-12 NOTE — Assessment & Plan Note (Signed)
Colonoscopy 05/2018.

## 2024-03-12 NOTE — Assessment & Plan Note (Signed)
 Improved recently  Continues  Wellbutrin  xl 150 mg daily  Sertraline  100 mg daily  More motivated and trying to move more

## 2024-03-12 NOTE — Assessment & Plan Note (Signed)
 Last vitamin D  Lab Results  Component Value Date   VD25OH 25.90 (L) 03/05/2024   Starting higher dose of D3   5000 international units daily

## 2024-03-12 NOTE — Assessment & Plan Note (Signed)
 bp in fair control at this time  BP Readings from Last 1 Encounters:  03/12/24 124/76   No changes needed Plan to continue hydrochlorothiazide  25 mg daily  Most recent labs reviewed  Disc lifstyle change with low sodium diet and exercise -doing well with this

## 2024-03-12 NOTE — Assessment & Plan Note (Signed)
 Using cpap regularly  Working on weight loss

## 2024-03-12 NOTE — Assessment & Plan Note (Signed)
 Bmi over 36 with co morbidities-HTN,lipid, OSA Working hard on weight loss  Has lost weight  Did take several mo of GLP-1 then could no longer afford   Encouraged to keep up the momentum   Discussed how this problem influences overall health and the risks it imposes  Reviewed plan for weight loss with lower calorie diet (via better food choices (lower glycemic and portion control) along with exercise building up to or more than 30 minutes 5 days per week including some aerobic activity and strength training

## 2024-03-12 NOTE — Assessment & Plan Note (Signed)
 Dexa 11/2022  Alendronate  since 07/2019 -plan 5 y course  No new falls or fractures  D level is low-discussed supplementation-going up to 5000 international units daily

## 2024-04-06 ENCOUNTER — Other Ambulatory Visit: Payer: Self-pay | Admitting: Family Medicine

## 2024-04-07 ENCOUNTER — Other Ambulatory Visit: Payer: Self-pay

## 2024-04-07 ENCOUNTER — Other Ambulatory Visit: Payer: Self-pay | Admitting: Family Medicine

## 2024-04-08 ENCOUNTER — Other Ambulatory Visit: Payer: Self-pay

## 2024-04-08 MED FILL — Alendronate Sodium Tab 70 MG: ORAL | 84 days supply | Qty: 12 | Fill #0 | Status: AC

## 2024-04-08 MED FILL — Bupropion HCl Tab ER 24HR 150 MG: ORAL | 90 days supply | Qty: 90 | Fill #0 | Status: AC

## 2024-04-10 ENCOUNTER — Telehealth: Admitting: Physician Assistant

## 2024-04-10 ENCOUNTER — Other Ambulatory Visit: Payer: Self-pay

## 2024-04-10 DIAGNOSIS — J069 Acute upper respiratory infection, unspecified: Secondary | ICD-10-CM | POA: Diagnosis not present

## 2024-04-10 DIAGNOSIS — H1032 Unspecified acute conjunctivitis, left eye: Secondary | ICD-10-CM

## 2024-04-10 DIAGNOSIS — H539 Unspecified visual disturbance: Secondary | ICD-10-CM

## 2024-04-10 MED ORDER — POLYMYXIN B-TRIMETHOPRIM 10000-0.1 UNIT/ML-% OP SOLN: 1.0000 [drp] | OPHTHALMIC | 0 refills | Status: AC

## 2024-04-10 NOTE — Progress Notes (Signed)
 Virtual Visit Consent   Emma Knight, you are scheduled for a virtual visit with a [facility] provider today. Just as with appointments in the office, your consent must be obtained to participate. Your consent will be active for this visit and any virtual visit you may have with one of our providers in the next 365 days. If you have a MyChart account, a copy of this consent can be sent to you electronically.  As this is a virtual visit, video technology does not allow for your provider to perform a traditional examination. This may limit your provider's ability to fully assess your condition. If your provider identifies any concerns that need to be evaluated in person or the need to arrange testing (such as labs, EKG, etc.), we will make arrangements to do so. Although advances in technology are sophisticated, we cannot ensure that it will always work on either your end or our end. If the connection with a video visit is poor, the visit may have to be switched to a telephone visit. With either a video or telephone visit, we are not always able to ensure that we have a secure connection.  By engaging in this virtual visit, you consent to the provision of healthcare and authorize for your insurance to be billed (if applicable) for the services provided during this visit. Depending on your insurance coverage, you may receive a charge related to this service.  I need to obtain your verbal consent now. Are you willing to proceed with your visit today? Emma Knight has provided verbal consent on 04/10/2024 for a virtual visit (video or telephone). Emma Ingber, PA-C  Date: 04/10/2024 2:21 PM   Virtual Visit via Video Note   I, Emma Knight, connected with  Emma Knight  ([id number], [birth year]) on 04/10/24 at 10:15 AM EST by a video-enabled telemedicine application and verified that I am speaking with the correct person using two identifiers.  Location: Patient: Virtual Visit Location Patient:  Home Provider: Virtual Visit Location Provider: Home Office   I discussed the limitations of evaluation and management by telemedicine and the availability of in person appointments. The patient expressed understanding and agreed to proceed.    History of Present Illness: Emma Knight is a 68 y.o. who identifies as a female who was assigned female at birth, and is being seen today for left eye irritation.  HPI: Patient presents for evaluation of left eye irritation for the last 2 days.  She reports redness of the eye with mild edema, crusting and drainage of the eye.  Reports now her right eye is affected as well.  No sick contacts.  No recent trauma.  Patient reports with the changes due to irritation, feels that there is a film over a film over her vision.  She also reports cold-like symptoms for the last few days, states she has had some nasal congestion.  She denies any fevers, chills, nausea, vomiting.    Problems:  Patient Active Problem List   Diagnosis Date Noted   Vitamin D  deficiency 03/12/2024   History of humerus fracture 01/12/2023   Primary hypertension 12/28/2022   Encounter for screening mammogram for breast cancer 10/31/2021   Colon cancer screening 09/21/2020   Elevated glucose 09/21/2020   Hydradenitis 05/14/2017   Depression, major, recurrent 02/25/2016   Family history of leukemia 03/18/2014   Encounter for routine gynecological examination 09/20/2012   Routine general medical examination at a health care facility 09/16/2012   OSA (obstructive sleep  apnea) 02/16/2010   Morbid obesity (HCC) 12/30/2009   Pure hypercholesterolemia 03/03/2009   Osteoporosis 05/20/2008   Disorder of bone and cartilage 05/20/2008    Allergies: No Known Allergies Medications:  Current Outpatient Medications:    trimethoprim-polymyxin b (POLYTRIM) ophthalmic solution, Place 1 drop into the left eye every 4 (four) hours for 7 days., Disp: 10 mL, Rfl: 0   alendronate  (FOSAMAX ) 70 MG  tablet, Take 1 tablet (70 mg total) by mouth every 7 (seven) days. TAKE WITH A FULL GLASS OF WATER ON AN EMPTY STOMACH., Disp: 12 tablet, Rfl: 2   buPROPion  (WELLBUTRIN  XL) 150 MG 24 hr tablet, Take 1 tablet (150 mg total) by mouth daily. In am, Disp: 90 tablet, Rfl: 2   CALCIUM PO, Take 1 capsule by mouth daily., Disp: , Rfl:    cholecalciferol (VITAMIN D3) 25 MCG (1000 UNIT) tablet, Take 2,000 Units by mouth daily. (Patient taking differently: Take 5,000 Units by mouth daily.), Disp: , Rfl:    Cyanocobalamin (B-12) 500 MCG TABS, Take 1 tablet by mouth daily., Disp: , Rfl:    hydrochlorothiazide  (HYDRODIURIL ) 25 MG tablet, Take 1 tablet (25 mg total) by mouth daily., Disp: 90 tablet, Rfl: 3   Multiple Vitamins-Minerals (MULTIVITAMIN WITH MINERALS) tablet, Take 1 tablet by mouth daily., Disp: , Rfl:    sertraline  (ZOLOFT ) 100 MG tablet, Take 1 tablet (100 mg total) by mouth at bedtime., Disp: 90 tablet, Rfl: 0  Observations/Objective: Patient is well-developed, well-nourished in no acute distress.  Resting comfortably  at home.  Head is normocephalic, atraumatic.  Pictures provided for ED visit reviewed, left eye with mucopurulent discharge along the eyelash line, conjunctivae is injected On video visit left eye appears to be more irritated compared to the right, no periorbital erythema noted extraocular movements intact No labored breathing. Speech is clear and coherent with logical content.  Patient is alert and oriented at baseline.   Assessment and Plan: 1. Acute URI (Primary)  2. Acute conjunctivitis of left eye, unspecified acute conjunctivitis type - trimethoprim-polymyxin b (POLYTRIM) ophthalmic solution; Place 1 drop into the left eye every 4 (four) hours for 7 days.  Dispense: 10 mL; Refill: 0   Follow Up Instructions: I discussed the assessment and treatment plan with the patient. The patient was provided an opportunity to ask questions and all were answered. The patient agreed  with the plan and demonstrated an understanding of the instructions.  A copy of instructions were sent to the patient via MyChart unless otherwise noted below.    The patient was advised to call back or seek an in-person evaluation if the symptoms worsen or if the condition fails to improve as anticipated.    Sawyer Mentzer, PA-C

## 2024-04-10 NOTE — Progress Notes (Addendum)
   Thank you for the details you included in the comment boxes. Those details are very helpful in determining the best course of treatment for you and help us  to provide the best care.Because of the level of swelling around the one eye, we recommend that you schedule a Virtual Urgent Care video visit in order for the provider to better assess what is going on.  The provider will be able to give you a more accurate diagnosis and treatment plan if we can more freely discuss your symptoms and with the addition of a virtual examination.   If you change your visit to a video visit, we will bill your insurance (similar to an office visit) and you will not be charged for this e-Visit. You will be able to stay at home and speak with the first available [facility] advanced practice provider. The link to do a video visit is in the drop down Menu tab of your Welcome screen in MyChart.

## 2024-04-10 NOTE — Patient Instructions (Signed)
  Emma Knight, thank you for joining Jerimie Mancuso, PA-C for today's virtual visit.  While this provider is not your primary care provider (PCP), if your PCP is located in our provider database this encounter information will be shared with them immediately following your visit.   A [facility] MyChart account gives you access to today's visit and all your visits, tests, and labs performed at [facility]  click here if you don't have a [name] or go to [email or web address]  Consent: (Patient) Emma Knight provided verbal consent for this virtual visit at the beginning of the encounter.  Current Medications:  Current Outpatient Medications:    trimethoprim-polymyxin b (POLYTRIM) ophthalmic solution, Place 1 drop into the left eye every 4 (four) hours for 7 days., Disp: 10 mL, Rfl: 0   alendronate  (FOSAMAX ) 70 MG tablet, Take 1 tablet (70 mg total) by mouth every 7 (seven) days. TAKE WITH A FULL GLASS OF WATER ON AN EMPTY STOMACH., Disp: 12 tablet, Rfl: 2   buPROPion  (WELLBUTRIN  XL) 150 MG 24 hr tablet, Take 1 tablet (150 mg total) by mouth daily. In am, Disp: 90 tablet, Rfl: 2   CALCIUM PO, Take 1 capsule by mouth daily., Disp: , Rfl:    cholecalciferol (VITAMIN D3) 25 MCG (1000 UNIT) tablet, Take 2,000 Units by mouth daily. (Patient taking differently: Take 5,000 Units by mouth daily.), Disp: , Rfl:    Cyanocobalamin (B-12) 500 MCG TABS, Take 1 tablet by mouth daily., Disp: , Rfl:    hydrochlorothiazide  (HYDRODIURIL ) 25 MG tablet, Take 1 tablet (25 mg total) by mouth daily., Disp: 90 tablet, Rfl: 3   Multiple Vitamins-Minerals (MULTIVITAMIN WITH MINERALS) tablet, Take 1 tablet by mouth daily., Disp: , Rfl:    sertraline  (ZOLOFT ) 100 MG tablet, Take 1 tablet (100 mg total) by mouth at bedtime., Disp: 90 tablet, Rfl: 0   Medications ordered in this encounter:  Meds ordered this encounter  Medications   trimethoprim-polymyxin b (POLYTRIM) ophthalmic  solution    Sig: Place 1 drop into the left eye every 4 (four) hours for 7 days.    Dispense:  10 mL    Refill:  0    Supervising Provider:   BLAISE ALEENE KIDD [[id number]]     *If you need refills on other medications prior to your next appointment, please contact your pharmacy*  Follow-Up: Call back or seek an in-person evaluation if the symptoms worsen or if the condition fails to improve as anticipated.  [facility] [phone]  Other Instructions    If you have been instructed to have an in-person evaluation today at a local Urgent Care facility, please use the link below. It will take you to a list of all of our available [facility], including address, phone number and hours of operation. Please do not delay care.  [facility]  If you or a family member do not have a primary care provider, use the link below to schedule a visit and establish care. When you choose a [facility] primary care physician or advanced practice provider, you gain a long-term partner in health. Find a Primary Care Provider  Learn more about [facility]'s in-office and virtual care options: [facility] - Get Care Now

## 2024-04-16 ENCOUNTER — Encounter: Payer: Self-pay | Admitting: Family Medicine

## 2024-04-21 ENCOUNTER — Other Ambulatory Visit: Payer: Self-pay | Admitting: Family Medicine

## 2024-04-22 ENCOUNTER — Other Ambulatory Visit: Payer: Self-pay

## 2024-04-22 ENCOUNTER — Other Ambulatory Visit (HOSPITAL_COMMUNITY): Payer: Self-pay

## 2024-04-22 MED ORDER — HYDROCHLOROTHIAZIDE 25 MG PO TABS
25.0000 mg | ORAL_TABLET | Freq: Every day | ORAL | 3 refills | Status: AC
  Filled 2024-04-22 – 2024-05-22 (×3): qty 90, 90d supply, fill #0

## 2024-04-23 ENCOUNTER — Other Ambulatory Visit: Payer: Self-pay

## 2024-04-23 ENCOUNTER — Other Ambulatory Visit: Payer: Self-pay | Admitting: Family Medicine

## 2024-04-23 DIAGNOSIS — F330 Major depressive disorder, recurrent, mild: Secondary | ICD-10-CM

## 2024-04-24 ENCOUNTER — Other Ambulatory Visit: Payer: Self-pay

## 2024-04-24 ENCOUNTER — Other Ambulatory Visit (HOSPITAL_COMMUNITY): Payer: Self-pay

## 2024-04-24 MED ORDER — SERTRALINE HCL 100 MG PO TABS
100.0000 mg | ORAL_TABLET | Freq: Every day | ORAL | 2 refills | Status: AC
  Filled 2024-04-24: qty 90, 90d supply, fill #0

## 2024-05-22 ENCOUNTER — Other Ambulatory Visit (HOSPITAL_COMMUNITY): Payer: Self-pay

## 2024-05-22 ENCOUNTER — Other Ambulatory Visit: Payer: Self-pay

## 2024-05-26 ENCOUNTER — Other Ambulatory Visit (HOSPITAL_COMMUNITY): Payer: Self-pay

## 2024-06-30 ENCOUNTER — Encounter

## 2024-11-10 ENCOUNTER — Ambulatory Visit
